# Patient Record
Sex: Male | Born: 1980 | Race: White | Hispanic: Yes | Marital: Single | Smoking: Former smoker
Health system: Southern US, Community
[De-identification: ages and names within clinical notes are randomized; demographics above are authoritative.]

## PROBLEM LIST (undated history)

## (undated) DIAGNOSIS — K625 Hemorrhage of anus and rectum: Secondary | ICD-10-CM

## (undated) HISTORY — DX: Hemorrhage of anus and rectum: K62.5

## (undated) HISTORY — PX: NO PAST SURGERIES: SHX2092

---

## 2001-03-16 ENCOUNTER — Emergency Department (HOSPITAL_COMMUNITY): Admission: EM | Admit: 2001-03-16 | Discharge: 2001-03-16 | Payer: Self-pay | Admitting: Emergency Medicine

## 2005-01-12 ENCOUNTER — Emergency Department (HOSPITAL_COMMUNITY): Admission: EM | Admit: 2005-01-12 | Discharge: 2005-01-12 | Payer: Self-pay | Admitting: Emergency Medicine

## 2006-09-05 ENCOUNTER — Emergency Department (HOSPITAL_COMMUNITY): Admission: EM | Admit: 2006-09-05 | Discharge: 2006-09-05 | Payer: Self-pay | Admitting: Emergency Medicine

## 2008-07-07 ENCOUNTER — Emergency Department (HOSPITAL_COMMUNITY): Admission: EM | Admit: 2008-07-07 | Discharge: 2008-07-07 | Payer: Self-pay | Admitting: Family Medicine

## 2010-07-24 LAB — POCT RAPID STREP A (OFFICE): Streptococcus, Group A Screen (Direct): NEGATIVE

## 2011-09-17 ENCOUNTER — Encounter (HOSPITAL_COMMUNITY): Payer: Self-pay | Admitting: *Deleted

## 2011-09-17 ENCOUNTER — Emergency Department (HOSPITAL_COMMUNITY)
Admission: EM | Admit: 2011-09-17 | Discharge: 2011-09-17 | Disposition: A | Discharge status: Home Health | Payer: Self-pay | Attending: Emergency Medicine | Admitting: Emergency Medicine

## 2011-09-17 DIAGNOSIS — L03221 Cellulitis of neck: Secondary | ICD-10-CM | POA: Insufficient documentation

## 2011-09-17 DIAGNOSIS — L0291 Cutaneous abscess, unspecified: Secondary | ICD-10-CM

## 2011-09-17 DIAGNOSIS — L0211 Cutaneous abscess of neck: Secondary | ICD-10-CM | POA: Insufficient documentation

## 2011-09-17 DIAGNOSIS — F17200 Nicotine dependence, unspecified, uncomplicated: Secondary | ICD-10-CM | POA: Insufficient documentation

## 2011-09-17 MED ORDER — IBUPROFEN 800 MG PO TABS
800.0000 mg | ORAL_TABLET | Freq: Once | ORAL | Status: AC
  Administered 2011-09-17: 800 mg via ORAL
  Filled 2011-09-17: qty 1

## 2011-09-17 MED ORDER — CEPHALEXIN 500 MG PO CAPS: 500.0000 mg | ORAL_CAPSULE | Freq: Four times a day (QID) | ORAL | Status: AC

## 2011-09-17 MED ORDER — IBUPROFEN 800 MG PO TABS: 800.0000 mg | ORAL_TABLET | Freq: Three times a day (TID) | ORAL | Status: AC | PRN

## 2011-09-17 MED ORDER — CEPHALEXIN 250 MG PO CAPS
500.0000 mg | ORAL_CAPSULE | Freq: Once | ORAL | Status: AC
  Administered 2011-09-17: 500 mg via ORAL
  Filled 2011-09-17: qty 2

## 2011-09-17 NOTE — ED Provider Notes (Signed)
History     CSN: [id number]  Arrival date & time 09/17/11  1932   None     Chief Complaint  Patient presents with  . Abscess    (Consider location/radiation/quality/duration/timing/severity/associated sxs/prior treatment) HPI Comments: Patient has an abscess to the upper mid back area, and a new tattoo.  It is less than 30, weeks old.  He has a history of recurrent abscesses, having one on his inner thigh, approximately 2 months ago.  He does denies fever, myalgias, nausea, vomiting, or diarrhea  Patient is a 31 y.o. male presenting with abscess. The history is provided by the patient.  Abscess  This is a recurrent problem. The current episode started less than one week ago. The problem occurs rarely. The problem has been gradually worsening. The abscess is present on the back. The problem is mild. The abscess is characterized by painfulness. The abscess first occurred at home. Pertinent negatives include no fever.    History reviewed. No pertinent past medical history.  History reviewed. No pertinent past surgical history.  No family history on file.  History  Substance Use Topics  . Smoking status: Current Everyday Smoker -- 3.0 packs/day  . Smokeless tobacco: Not on file  . Alcohol Use: 1.2 oz/week    2 Cans of beer per week      Review of Systems  Constitutional: Negative for fever and chills.  HENT: Negative for neck pain and neck stiffness.   Musculoskeletal: Negative for myalgias.  Skin: Positive for wound. Negative for rash.    Allergies  Review of patient's allergies indicates no known allergies.  Home Medications   Current Outpatient Rx  Name Route Sig Dispense Refill  . CEPHALEXIN 500 MG PO CAPS Oral Take 1 capsule (500 mg total) by mouth 4 (four) times daily. 30 capsule 0  . IBUPROFEN 800 MG PO TABS Oral Take 1 tablet (800 mg total) by mouth every 8 (eight) hours as needed for pain. 30 tablet 0    BP 124/70  Pulse 98  Temp(Src) 98.5 F (36.9 C)  (Oral)  Resp 16  SpO2 97%  Physical Exam  Constitutional: He appears well-developed and well-nourished.  HENT:  Head: Normocephalic.  Eyes: Pupils are equal, round, and reactive to light.  Neck: Normal range of motion.  Cardiovascular: Normal rate.   Pulmonary/Chest: Effort normal.  Neurological: He is alert.  Skin: Skin is warm.       ED Course  INCISION AND DRAINAGE Date/Time: 09/17/2011 9:00 PM Performed by: Arman Filter Authorized by: Arman Filter Consent: Verbal consent obtained. Risks and benefits: risks, benefits and alternatives were discussed Consent given by: patient Patient understanding: patient states understanding of the procedure being performed Patient identity confirmed: verbally with patient Time out: Immediately prior to procedure a "time out" was called to verify the correct patient, procedure, equipment, support staff and site/side marked as required. Type: abscess Body area: head/neck Location details: neck Anesthesia: local infiltration Local anesthetic: lidocaine 2% with epinephrine Anesthetic total: 1 ml Patient sedated: no Scalpel size: 11 Needle gauge: 22 Complexity: simple Drainage: purulent Drainage amount: scant Packing material: 1/4 in iodoform gauze Patient tolerance: Patient tolerated the procedure well with no immediate complications.   (including critical care time)  Labs Reviewed - No data to display No results found.   1. Abscess       MDM   Small abscess without surrounding cellulitis or erythema. Date       Arman Filter, NP 09/17/11 2104  Arman Filter, NP 09/17/11 2104

## 2011-09-17 NOTE — ED Notes (Signed)
Pt states he a sore started forming on his neck on Monday.  Today the sore began to drain clear drainage.  Abscess is swollen, red with clear exudate.  States he had a tatoo etched 3 weeks prior.  Pt does not remember last tetanus vaccination.

## 2011-09-17 NOTE — Discharge Instructions (Signed)
Absceso (Abscess) Un absceso (divieso o fornculo) es una zona infectada que contiene pus. CUIDADOS EN EL HOGAR  Tome slo la medicacin que le indic el mdico.   Mantenga la piel limpia alrededor del absceso. Mantenga limpia la ropa que pueda rozarlo.   Cambie todos los apsitos (vendajes) segn las indicaciones del mdico.   Evite el contacto directo de la piel con [employer]. La infeccin puede diseminarse por el contacto con otras personas a travs de la piel.   Mantenga una buena higiene y no comparta elementos personales.   No comparta equipos deportivos, toallas o jacuzzis. Dchese despus de cada prctica o torneo.   Si la zona que drena no puede cubrirse:   No practique deportes.   No enve al nio a la guardera [employer] la herida se haya curado o hasta que el lquido (de drenado) deje de [employer].   Comunquese con su mdico para concurrir a las visitas de control.  BUSQUE AYUDA DE INMEDIATO SI:  Aumenta el dolor, el enrojecimiento, o la Paramedic de la herida.   Drena lquido o sangra en la regin de la herida.   Tiene dolores musculares, escalofros, o fiebre.   Usted o su nio tienen una temperatura oral de ms de 102 F (38.9 C) y no puede controlarla con medicamentos.   Su beb tiene ms de 3 meses y su temperatura rectal es de 102 F (38.9 C) o ms.  ASEGRESE DE QUE:   Comprende esas instrucciones para el alta mdica.   Controlar su enfermedad.   Pedir ayuda de inmediato si no mejora o empeora.  Document Released: 06/26/2008 Document Revised: 03/19/2011 [facility] Patient Information 2012 [place], [place]. As discussed, in 2 days, remove the packing and allow to heal

## 2011-09-19 NOTE — ED Provider Notes (Signed)
Medical screening examination/treatment/procedure(s) were performed by non-physician practitioner and as supervising physician I was immediately available for consultation/collaboration.   Antaeus Karel A Britiany Silbernagel, MD 09/19/11 0743 

## 2011-10-20 ENCOUNTER — Encounter (HOSPITAL_COMMUNITY): Payer: Self-pay | Admitting: Emergency Medicine

## 2011-10-20 ENCOUNTER — Emergency Department (HOSPITAL_COMMUNITY)
Admission: EM | Admit: 2011-10-20 | Discharge: 2011-10-20 | Disposition: A | Discharge status: Home / Self Care | Payer: Self-pay | Attending: Emergency Medicine | Admitting: Emergency Medicine

## 2011-10-20 DIAGNOSIS — M542 Cervicalgia: Secondary | ICD-10-CM | POA: Insufficient documentation

## 2011-10-20 DIAGNOSIS — L02414 Cutaneous abscess of left upper limb: Secondary | ICD-10-CM

## 2011-10-20 DIAGNOSIS — F17200 Nicotine dependence, unspecified, uncomplicated: Secondary | ICD-10-CM | POA: Insufficient documentation

## 2011-10-20 DIAGNOSIS — IMO0002 Reserved for concepts with insufficient information to code with codable children: Secondary | ICD-10-CM | POA: Insufficient documentation

## 2011-10-20 MED ORDER — SULFAMETHOXAZOLE-TRIMETHOPRIM 800-160 MG PO TABS: 1.0000 | ORAL_TABLET | Freq: Two times a day (BID) | ORAL | Status: AC

## 2011-10-20 MED ORDER — ONDANSETRON HCL 4 MG/2ML IJ SOLN
4.0000 mg | Freq: Once | INTRAMUSCULAR | Status: AC
  Administered 2011-10-20: 4 mg via INTRAMUSCULAR
  Filled 2011-10-20: qty 2

## 2011-10-20 MED ORDER — HYDROCODONE-ACETAMINOPHEN 5-325 MG PO TABS: ORAL_TABLET | ORAL | Status: DC

## 2011-10-20 MED ORDER — CYCLOBENZAPRINE HCL 10 MG PO TABS: 10.0000 mg | ORAL_TABLET | Freq: Three times a day (TID) | ORAL | Status: AC | PRN

## 2011-10-20 MED ORDER — MORPHINE SULFATE 4 MG/ML IJ SOLN
4.0000 mg | Freq: Once | INTRAMUSCULAR | Status: AC
  Administered 2011-10-20: 4 mg via INTRAMUSCULAR
  Filled 2011-10-20: qty 1

## 2011-10-20 NOTE — ED Notes (Signed)
Pt c/o left arm pain with redness x 3 days and swelling; pt sts started small area but now large; pt c/o ingrown hair on back of neck

## 2011-10-20 NOTE — ED Notes (Signed)
Bacitracin, sterile guaze, & tape placed over incision on L arm

## 2011-10-20 NOTE — ED Provider Notes (Cosign Needed)
History  This chart was scribed for Ward Givens, MD by Erskine Emery. This patient was seen in room TR10C/TR10C and the patient's care was started at 18:40.   CSN: [id number]  Arrival date & time 10/20/11  1545   None     Chief Complaint  Patient presents with  . Arm Pain  . Abscess    (Consider location/radiation/quality/duration/timing/severity/associated sxs/prior treatment) HPI  Greg Maldonado is a 31 y.o. male who presents to the Emergency Department complaining of a constant, moderate draining abscess on his left forearm near his elbow for the last 3-4 days. Pt reports that the area is painful and reports associated redness and swelling. Pt also reports associated fever and chills last night. Pt denies any nausea and vomiting. Pt's partner reports there are spiders on their back porch. Pt reports he had another cyst about a month ago on the back of his neck. Pt reports that he uses Suave soap. Pt reports he is right handed. No prior medical hx reported. Pt also c/o pain in his left neck when he moves his head from side to side.   No PCP   History reviewed. No pertinent past medical history.  History reviewed. No pertinent past surgical history.  History reviewed. No pertinent family history.  History  Substance Use Topics  . Smoking status: Current Everyday Smoker -- 3.0 packs/day  . Smokeless tobacco: Not on file  . 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    Review of Systems  Constitutional: Positive for fever and chills.  Gastrointestinal: Negative for nausea and vomiting.  Skin:       Abscess on left arm    Allergies  Review of patient's allergies indicates no known allergies.  Home Medications   Current Outpatient Rx  Name Route Sig Dispense Refill  . ACETAMINOPHEN 500 MG PO TABS Oral Take 1,000 mg by mouth every 6 (six) hours as needed. For pain      BP 121/66  Pulse 100  Temp 99.6 F (37.6  C) (Oral)  Resp 18  SpO2 97%  Vital signs normal except low grade temp and tachycardia   Physical Exam  Nursing note and vitals reviewed. Constitutional: He is oriented to person, place, and time. He appears well-developed and well-nourished. No distress.  HENT:  Head: Normocephalic and atraumatic.  Right Ear: External ear normal.  Left Ear: External ear normal.  Eyes: Conjunctivae and EOM are normal. Pupils are equal, round, and reactive to light.  Neck: Normal range of motion. Neck supple. No tracheal deviation present.       Tender in the left trapezius muscle proximally  Cardiovascular: Normal rate.   Pulmonary/Chest: Effort normal. No respiratory distress.  Musculoskeletal: Normal range of motion. He exhibits tenderness.       8x10 cm area of erythema with an area of 7x8 cm induration on left proximal forearm near the elbow that is tender to palpation.   Neurological: He is alert and oriented to person, place, and time.  Skin: Skin is warm and dry.  Psychiatric: He has a normal mood and affect. His behavior is normal.    ED Course  Procedures (including critical care time)  Meds ordered this encounter  Medications  . morphine 4 MG/ML injection 4 mg    Sig:   . ondansetron (ZOFRAN) injection 4 mg    Sig:     INCISION AND DRAINAGE  Performed by: Ward Givens Consent: Verbal consent obtained. Risks and benefits: risks, benefits and alternatives were discussed Type: abscess  Body area: left forearm  Anesthesia: local infiltration  Local anesthetic: lidocaine 1% + 1 epinephrine  Anesthetic total: 2 ml  Complexity: complex Blunt dissection to break up loculations  Drainage: purulent  Drainage amount: moderate  Packing material: 1/4 in iodoform gauze  Patient tolerance: Patient tolerated the procedure well with no immediate complications.      DIAGNOSTIC STUDIES: Oxygen Saturation is 97% on room air, adequate by my interpretation.    COORDINATION OF  CARE:  18:49--I discussed treatment plan including lancing the abscess and a pain medication injection with pt and pt agreed. I informed the pt to try using dial soap for a while.   1. Abscess of forearm, left   2. Musculoskeletal neck pain    New Prescriptions   CYCLOBENZAPRINE (FLEXERIL) 10 MG TABLET    Take 1 tablet (10 mg total) by mouth 3 (three) times daily as needed for muscle spasms.   HYDROCODONE-ACETAMINOPHEN (NORCO) 5-325 MG PER TABLET    Take 1 or 2 po Q 6hrs for pain   SULFAMETHOXAZOLE-TRIMETHOPRIM (SEPTRA DS) 800-160 MG PER TABLET    Take 1 tablet by mouth 2 (two) times daily.    Plan discharge  Devoria Albe, MD, FACEP    MDM   I personally performed the services described in this documentation, which was scribed in my presence. The recorded information has been reviewed and considered.  Devoria Albe, MD, Armando Gang        Ward Givens, MD 10/20/11 2003

## 2012-08-15 ENCOUNTER — Encounter (HOSPITAL_COMMUNITY): Payer: Self-pay | Admitting: Nurse Practitioner

## 2012-08-15 ENCOUNTER — Emergency Department (HOSPITAL_COMMUNITY)
Admission: EM | Admit: 2012-08-15 | Discharge: 2012-08-15 | Disposition: A | Discharge status: Home / Self Care | Payer: Self-pay | Attending: Emergency Medicine | Admitting: Emergency Medicine

## 2012-08-15 DIAGNOSIS — Y9389 Activity, other specified: Secondary | ICD-10-CM | POA: Insufficient documentation

## 2012-08-15 DIAGNOSIS — X500XXA Overexertion from strenuous movement or load, initial encounter: Secondary | ICD-10-CM | POA: Insufficient documentation

## 2012-08-15 DIAGNOSIS — H9209 Otalgia, unspecified ear: Secondary | ICD-10-CM | POA: Insufficient documentation

## 2012-08-15 DIAGNOSIS — Y990 Civilian activity done for income or pay: Secondary | ICD-10-CM | POA: Insufficient documentation

## 2012-08-15 DIAGNOSIS — S93409A Sprain of unspecified ligament of unspecified ankle, initial encounter: Secondary | ICD-10-CM | POA: Insufficient documentation

## 2012-08-15 DIAGNOSIS — L0201 Cutaneous abscess of face: Secondary | ICD-10-CM | POA: Insufficient documentation

## 2012-08-15 DIAGNOSIS — Y9289 Other specified places as the place of occurrence of the external cause: Secondary | ICD-10-CM | POA: Insufficient documentation

## 2012-08-15 DIAGNOSIS — F17200 Nicotine dependence, unspecified, uncomplicated: Secondary | ICD-10-CM | POA: Insufficient documentation

## 2012-08-15 DIAGNOSIS — L03211 Cellulitis of face: Secondary | ICD-10-CM | POA: Insufficient documentation

## 2012-08-15 MED ORDER — CEPHALEXIN 500 MG PO CAPS: 500.0000 mg | ORAL_CAPSULE | Freq: Four times a day (QID) | ORAL | Status: DC

## 2012-08-15 MED ORDER — CEPHALEXIN 250 MG PO CAPS
500.0000 mg | ORAL_CAPSULE | Freq: Once | ORAL | Status: AC
  Administered 2012-08-15: 500 mg via ORAL
  Filled 2012-08-15: qty 2

## 2012-08-15 MED ORDER — TRAMADOL HCL 50 MG PO TABS: 50.0000 mg | ORAL_TABLET | Freq: Once | ORAL | Status: DC

## 2012-08-15 MED ORDER — TRAMADOL HCL 50 MG PO TABS
50.0000 mg | ORAL_TABLET | Freq: Once | ORAL | Status: AC
  Administered 2012-08-15: 50 mg via ORAL
  Filled 2012-08-15: qty 1

## 2012-08-15 MED ORDER — LIDOCAINE HCL (PF) 1 % IJ SOLN
5.0000 mL | Freq: Once | INTRAMUSCULAR | Status: AC
  Administered 2012-08-15: 5 mL

## 2012-08-15 NOTE — ED Notes (Signed)
I&D tray at bedside.

## 2012-08-15 NOTE — ED Provider Notes (Signed)
History     CSN: [id number]  Arrival date & time 08/15/12  1840   None     Chief Complaint  Patient presents with  . Wound Infection  . Ankle Pain  . Otalgia    (Consider location/radiation/quality/duration/timing/severity/associated sxs/prior treatment) HPI Comments: Patient states he twisted his left ankle at work on Friday.  Since that, time.  That's been moderately swollen with discomfort.  He is able to bear weight, and walk.  He has not done anything to alleviate the pain.  He also has an abscess on his chin, which he thought started as a pimple.  He squeezed it, which only made it worse Sunday.larger, and more red today.  His wife squeezed it and a moderate amount of pus came out.  It is now 2-3 times more swollen.  It has been patient has a history of an abscess previously.  Several years ago  Patient is a 32 y.o. male presenting with ankle pain and ear pain. The history is provided by the patient.  Ankle Pain Location:  Ankle Time since incident:  4 days Injury: yes   Ankle location:  L ankle Pain details:    Radiates to:  Does not radiate   Severity:  Mild   Onset quality:  Sudden   Duration:  4 days   Timing:  Constant   Progression:  Improving Associated symptoms: no fever   Otalgia Associated symptoms: no ear discharge and no fever     No past medical history on file.  No past surgical history on file.  No family history on file.  History  Substance Use Topics  . Smoking status: Current Every Day Smoker -- 3.00 packs/day  . Smokeless tobacco: Not on file  . Alcohol Use: 1.2 oz/week    2 Cans of beer per week      Review of Systems  Constitutional: Negative for fever and chills.  HENT: Positive for ear pain. Negative for trouble swallowing, neck stiffness and ear discharge.   Musculoskeletal: Positive for joint swelling.  Skin: Positive for wound.  All other systems reviewed and are negative.    Allergies  Review of patient's allergies  indicates no known allergies.  Home Medications   Current Outpatient Rx  Name  Route  Sig  Dispense  Refill  . acetaminophen (TYLENOL) 500 MG tablet   Oral   Take 1,000 mg by mouth every 6 (six) hours as needed for pain.          Marland Kitchen ibuprofen (ADVIL,MOTRIN) 200 MG tablet   Oral   Take 800 mg by mouth once.         . Multiple Vitamin (MULTIVITAMIN WITH MINERALS) TABS   Oral   Take 1 tablet by mouth daily.         . cephALEXin (KEFLEX) 500 MG capsule   Oral   Take 1 capsule (500 mg total) by mouth 4 (four) times daily.   28 capsule   0   . traMADol (ULTRAM) 50 MG tablet   Oral   Take 1 tablet (50 mg total) by mouth once.   30 tablet   0     BP 139/73  Pulse 108  Temp(Src) 99.1 F (37.3 C) (Oral)  Resp 20  SpO2 97%  Physical Exam  Constitutional: He is oriented to person, place, and time. He appears well-developed and well-nourished.  HENT:  Head: Normocephalic.  Eyes: Pupils are equal, round, and reactive to light.  Neck: Normal range of motion.  Cardiovascular: Normal rate and regular rhythm.   Pulmonary/Chest: Effort normal.  Abdominal: Soft.  Musculoskeletal: He exhibits edema. He exhibits no tenderness.  Neurological: He is alert and oriented to person, place, and time.  Skin: There is erythema.  Abscess to the apex of the chin    ED Course  INCISION AND DRAINAGE Date/Time: 08/15/2012 10:24 PM Performed by: Arman Filter Authorized by: Arman Filter Consent: Verbal consent obtained. Risks and benefits: risks, benefits and alternatives were discussed Consent given by: patient Patient understanding: patient states understanding of the procedure being performed Patient identity confirmed: verbally with patient Type: abscess Body area: head/neck Location details: face Anesthesia: local infiltration Local anesthetic: lidocaine 1% without epinephrine Anesthetic total: 2 ml Patient sedated: no Scalpel size: 11 Needle gauge: 22 Incision type:  single straight Complexity: simple Drainage: purulent Drainage amount: moderate Wound treatment: wound left open Patient tolerance: Patient tolerated the procedure well with no immediate complications.   (including critical care time)  Labs Reviewed - No data to display No results found.   1. Facial abscess   2. Ankle sprain and strain, left, initial encounter       MDM  Patient.  Abscessed chin, was I indeed, patient has a moderately sprained ankle from Friday.  Apply Ace bandage       Arman Filter, NP 08/15/12 2236

## 2012-08-15 NOTE — ED Notes (Addendum)
Pt with multiple complaints C/o red, painful, draining abscess to chin since saturday. L sided ear ache since waking this am. L ankle swelling since Sunday with no pain or known injuries. Took ibuprofen PTA with some relief

## 2012-08-15 NOTE — ED Notes (Signed)
Ace wrap applied to left ankle.

## 2012-08-16 NOTE — ED Provider Notes (Signed)
Medical screening examination/treatment/procedure(s) were performed by non-physician practitioner and as supervising physician I was immediately available for consultation/collaboration.   Richardean Canal, MD 08/16/12 2130

## 2013-10-02 ENCOUNTER — Encounter (HOSPITAL_COMMUNITY): Payer: Self-pay | Admitting: Emergency Medicine

## 2013-10-02 ENCOUNTER — Emergency Department (HOSPITAL_COMMUNITY)
Admission: EM | Admit: 2013-10-02 | Discharge: 2013-10-02 | Disposition: A | Discharge status: Home / Self Care | Payer: Self-pay | Attending: Emergency Medicine | Admitting: Emergency Medicine

## 2013-10-02 DIAGNOSIS — Z792 Long term (current) use of antibiotics: Secondary | ICD-10-CM | POA: Insufficient documentation

## 2013-10-02 DIAGNOSIS — S39012A Strain of muscle, fascia and tendon of lower back, initial encounter: Secondary | ICD-10-CM

## 2013-10-02 DIAGNOSIS — F17200 Nicotine dependence, unspecified, uncomplicated: Secondary | ICD-10-CM | POA: Insufficient documentation

## 2013-10-02 DIAGNOSIS — X503XXA Overexertion from repetitive movements, initial encounter: Secondary | ICD-10-CM | POA: Insufficient documentation

## 2013-10-02 DIAGNOSIS — Y92009 Unspecified place in unspecified non-institutional (private) residence as the place of occurrence of the external cause: Secondary | ICD-10-CM | POA: Insufficient documentation

## 2013-10-02 DIAGNOSIS — Y9389 Activity, other specified: Secondary | ICD-10-CM | POA: Insufficient documentation

## 2013-10-02 DIAGNOSIS — IMO0002 Reserved for concepts with insufficient information to code with codable children: Secondary | ICD-10-CM | POA: Insufficient documentation

## 2013-10-02 MED ORDER — TRAMADOL HCL 50 MG PO TABS: 50.0000 mg | ORAL_TABLET | Freq: Four times a day (QID) | ORAL | Status: DC | PRN

## 2013-10-02 MED ORDER — METHOCARBAMOL 500 MG PO TABS: 500.0000 mg | ORAL_TABLET | Freq: Two times a day (BID) | ORAL | Status: DC

## 2013-10-02 MED ORDER — IBUPROFEN 200 MG PO TABS: 800.0000 mg | ORAL_TABLET | Freq: Once | ORAL | Status: DC

## 2013-10-02 NOTE — Discharge Instructions (Signed)
Distensin de la cintura con rehabilitacin (Low Back Strain with Rehab) Una distensin es una lesin en la que el tendn o msculo se rasga. Los msculos y tendones de la cintura son susceptibles de sufrir distensiones. Sin embargo, estos msculos y tendones son muy fuertes y se requiere de una gran fuerza para lesionarlos. Los esguinces se clasifican en tres categoras. En el esguince de grado 1 hay dolor, pero el tendn no est estirado. En los esguinces de grado 2 hay un ligamento alargado, debido a un estiramiento o desgarro parcial. En el esguince de grado 2 an hay funcionamiento, aunque ste puede disminuir. Una distensin en grado 3 es la ruptura completa del msculo o el tendn, y suele quedar incapacitada la funcin. SNTOMAS  Dolor en la cintura.  Dolor en la espalda, que a menudo afecta ms a un lado que al otro.  Dolor que empeora con los movimientos y se siente en las caderas, las nalgas o la zona posterior del muslo.  Espasmos en los msculos de la espalda.  Hinchazn a lo largo de los msculos de la espalda.  Prdida de la fuerza en estos msculos.  Ruido de "crack" (crepitacin) al tocarlos. CAUSAS  La distensin se produce cuando se coloca una fuerza en el msculo o tendn que es mayor de lo que puede soportar. Las causas ms frecuentes de la lesin son:  Uso excesivo prolongado de la unidad msculo - tendn en la zona de la cintura, generalmente debido a una postura incorrecta.  Una nica lesin o fuerza violenta aplicada en la espalda. LOS RIESGOS AUMENTAN CON  Cualquier deporte en el que el movimiento ocasione una fuerza de giro en la espalda o una gran inclinacin de la cintura; (ftbol americano, rugby, levantamiento de pesas, bowling, golf, tenis, patinaje, deportes con raqueta, natacin, carrera, gimnasia, clavados).  Poca fuerza y flexibilidad.  No hacer un precalentamiento adecuado.  Historia familiar de dolor de cintura o trastornos en  discos.  Cirugas previas en la espalda (especialmente fusin).  Tcnica incorrecta al levantar objetos pesados.  Permanecer largo tiempo sentado, especialmente de manera incorrecta. MEDIDAS DE PREVENCIN  Aprenda y utilice la mecnica correcta al sentarse o al levantarse (mantener la postura correcta al sentarse; levantar objetos inclinando las rodillas y las piernas y no la cintura).  Precalentamiento adecuado y elongacin antes de la actividad.  Descanso y recuperacin entre actividades.  Mantener la forma fsica:  Fuerza, flexibilidad y resistencia muscular.  Capacidad cardiovascular. PRONSTICO Si se trata adecuadamente, generalmente es curable dentro de las 6 semanas. POSIBLES COMPLICACIONES:  La recurrencia frecuente de los sntomas puede dar como resultado un problema crnico.  La inflamacin crnica, degeneracin cicatrizal del tendn y ruptura parcial del tendn.  Demora de la curacin o de la resolucin de los sntomas.  Discapacidad prolongada. CONSIDERACIONES GENERALES PARA EL TRATAMIENTO El tratamiento inicial incluye el uso de medicamentos y la aplicacin de hielo para reducir el dolor y la inflamacin. Los ejercicios de elongacin y fortalecimiento pueden ayudar a reducir el dolor con la actividad. Los ejercicios pueden realizarse en el hogar o con un terapeuta. Los casos graves pueden requerir la derivacin a un fisioterapeuta para realizar una evaluacin y comenzar un tratamiento, como ultrasonido. El profesional podr indicarle el uso de un dispositivo ortopdico para ayudar a reducir el dolor y la inflamacin. A menudo, demasiado reposo en cama podr resultar en ms daos que beneficios. Podrn prescribirle inyecciones de corticoides. Sin embargo, esto deber reservarse para los casos ms graves. Es importante evitar el   uso de la espalda cuando se levantan objetos. Por la noche, se aconseja que usted duerma boca arriba, sobre un colchn firme y coloque una almohada  debajo de las rodillas. Si no se obtiene xito con el tratamiento conservador, ser necesario someterse a una ciruga.  MEDICAMENTOS   Si es necesaria la administracin de medicamentos para el dolor, se recomiendan los antiinflamatorios no esteroides, como aspirina e ibuprofeno y otros calmantes menores, como acetaminofeno.  No tome medicamentos para el dolor dentro de los 7 das previos a la ciruga.  El profesional podr prescribirle calmantes si lo considera necesario. Utilcelos como se le indique y slo cuando lo necesite.  Podr beneficiarse con pomadas sobre la piel.  En algunos casos se indica una inyeccin de corticosteroides. Estas inyecciones deben reservarse para los casos graves, porque slo se pueden administrar una determinada cantidad de veces. CALOR Y FRO   El fro (con hielo) debe aplicarse durante 10 a 15 minutos cada 2  3 horas para reducir la inflamacin y el dolor e inmediatamente despus de cualquier actividad que agrava los sntomas. Utilice bolsas o un masaje de hielo.  El calor puede usarse antes de elongar y de las actividades de fortalecimiento indicadas por el profesional, le fisioterapeuta o el entrenador. Utilice una bolsa trmica o un pao hmedo. SOLICITE ATENCIN MDICA SI:   Los sntomas empeoran o no mejoran en 2 a 4 semanas, an realizando un tratamiento.  Siente adormecimiento, debilitamiento o prdida de control de la vejiga o el intestino.  Desarrolla nuevos e inexplicables sntomas. (Los medicamentos indicados en el tratamiento le ocasionan efectos secundarios). EJERCICIOS  EJERCICIOS DE AMPLITUD DE MOVIMIENTOS Y ELONGACIN Distensin de cintura La mayora de las personas con dolor de espalda baja encuentran que sus sntomas empeoran al doblarse hacia adelante (flexin) o al arquear la regin inferior de la espalda (extensin). Los ejercicios que le ayudarn a resolver sus sntomas se centrarn en el movimiento contrario.  El mdico,  fisioterapeuta o entrenador le ayudarn a determinar qu ejercicios sern de ayuda para resolver su dolor de espalda. No realice ningn ejercicio sin consultarlo antes con el profesional. Discontinue los ejercicios que empeoran sus sntomas, hasta que hable con el mdico. Si siente dolor, entumecimiento u hormigueo que irradia hacia los glteos, piernas o pies, el objetivo de esta terapia es que estos sntomas se acerquen a la espalda y eventualmente desaparezcan. A veces, estos sntomas en las piernas mejoran, pero el dolor de espalda empeora. Este suele ser un indicio de progreso en su rehabilitacin. Asegrese de que estar atento a cualquier cambio en sus sntomas y las actividades que ha hecho en las 24 horas antes del cambio. Compartir esta informacin con su mdico le permitir mayor eficiencia para tratar su enfermedad.  Estos ejercicios le ayudarn en la recuperacin de la lesin. Los sntomas podrn aliviarse con o sin asistencia adicional de su mdico, fisioterapeuta o entrenador. Al completar estos ejercicios, recuerde:  Restaurar la flexibilidad del tejido ayuda a que las articulaciones recuperen el movimiento normal. Esto permite que el movimiento y la actividad sea ms saludables y menos dolorosos.  Para que sea efectiva, cada elongacin debe realizarse durante al menos 30 segundos.  La elongacin nunca debe ser dolorosa. Deber sentir slo un alargamiento o distensin suave del tejido que estira. EJERCICIOS DE AMPLITUD DE MOVIMIENTOS Y ELONGACIN: ELONGACION Flexin - una rodilla al pecho  Recustese en una cama dura o sobre el piso, con ambas piernas extendidas al frente.  Manteniendo una pierna en contacto con   el piso, lleve la rodilla opuesta al pecho. Mantenga la pierna en esa posicin, sostenindola por la zona posterior del muslo o por la rodilla.  Presione hasta sentir un suave estiramiento en la cintura. Mantenga esta posicin durante __________ segundos.  Libere la pierna  lentamente y repita el ejercicio con el lado opuesto. Reptalo __________ veces. Realice este ejercicio __________ veces por da.  ELONGACIN Flexin, dos rodillas al pecho   Recustese en una cama dura o sobre el piso, con ambas piernas extendidas al frente.  Manteniendo una pierna en contacto con el piso, lleve la rodilla opuesta al pecho.  Tense los msculos del estmago para apoyar la espalda y levante la otra rodilla hacia el pecho. Mantenga las piernas en su lugar y tmese por detrs de las caderas o las rodillas.  Con ambas rodillas en el pecho, tire hasta que sienta un estiramiento en la parte trasera de la espalda. Mantenga esta posicin durante __________ segundos.  Tense los msculos del estmago y baje las piernas de a una por vez. Reptalo __________ veces. Realice este ejercicio __________ veces por da.  ELONGACIN Rotacin de la zona baja del tronco  Recustese sobre una cama firme o sobre el suelo. Mantenga las piernas al frente, doble las rodillas de modo que ambas apunten hacia el techo y los pies queden bien apoyados en el piso.  Extienda los brazos a cada lado. Esto estabilizar la zona superior del cuerpo, manteniendo los hombros en contacto con el piso.  Suave y lentamente deje caer ambas rodillas juntas hacia un lado, hasta sentir un ligero estiramiento en la cintura. Mantenga esta posicin durante __________ segundos.  Tensione los msculos del estmago para sostener la cintura mientras lleva las rodillas nuevamente a la posicin inicial. Repita el ejercicio hacia el otro lado. Reptalo __________ veces. Realice este ejercicio __________ veces por da. EJERCICIOS DE AMPLITUD DE MOVIMIENTOS Y FLEXIBILIDAD: ELONGACIN Extensin posicin prona sobre los codos  Acustese sobre el estmago sobre el piso, una cama ser muy blanda. Coloque las palmas a una distancia igual al ancho de los hombres y a la altura de la cabeza.  Coloque los codos bajo los hombros. Si siente  dolor, colquese almohadas debajo del pecho.  Deje que su cuerpo se relaje, de modo que las caderas queden ms abajo y tengan ms contacto con el piso.  Mantenga esta posicin durante __________ segundos.  Vuelva lentamente a la posicin plana sobre el piso. Reptalo __________ veces. Realice este ejercicio __________ veces por da.  AMPLITUD DE MOVIMIENTOS - Extensin - flexin de brazos en posicin prona  Acustese sobre el estmago sobre el piso, una cama ser muy blanda. Coloque las palmas a una distancia igual al ancho de los hombres y a la altura de la cabeza.  Mantenga la espalda tan relajada como pueda, enderece lentamente los codos mientras mantiene las caderas contra el suelo. Puede modificar la posicin de las manos para estar ms cmodo. A medida que gana movimiento, sus manos quedarn ms por debajo de los hombros.  Mantenga cada posicin durante __________segundos.  Vuelva lentamente a la posicin plana sobre el piso. Reptalo __________ veces. Realice este ejercicio __________ veces por da.  AMPLITUD DE MOVIMIENTOS Cuadrpedo [place] vertebral neutral  Coloque las manos y las rodillas en una superficie firme. Las manos deben quedar a la altura de los hombros y las rodillas debajo de las caderas. Puede colocar algo debajo las rodillas para estar ms cmodo.  Haga caer la cabeza y apunte el cccix hacia el   suelo debajo de usted. De este modo se redondear la cintura, en una postura similar a un gato enojado. Mantenga esta posicin durante __________ segundos.  Lentamente levante la cabeza y afloje el cccix para que se hunda el cuerpo en un gran arco, como un caballo.  Mantenga esta posicin durante __________ segundos.  Reptalo hasta sentir calor en la cintura.  Ahora encuentre su "punto ideal". Ser la posicin ms cmoda entre las dos posiciones anteriores. En esta posicin es cuando su columna est neutral. Una vez que encuentre la posicin, tensione los msculos del  estmago para sostener la zona inferior de la espalda.  Mantenga esta posicin durante __________ segundos. Reptalo __________ veces. Realice este ejercicio __________ veces por da.  EJERCICIOS DE FORTALECIMIENTO - Distensin de la cintura Estos ejercicios le ayudarn en la recuperacin de la lesin. Estos ejercicios deben hacerse cerca de su "punto dulce". Este es el arco neutro, de la parte baja de la espalda, en algn lugar entre la posicin completamente redondeada y arqueada plenamente, que es la posicin menos dolorosa. Cuando se realiza en este nivel de seguridad del movimiento, estos ejercicios se pueden utilizar para las personas que tienen una lesin basada en flexin o extensin. Con estos ejercicios, los sntomas podrn desaparecer con o sin mayor intervencin del profesional, el fisioterapeuta o el entrenador. Al completar estos ejercicios, recuerde:   Los msculos pueden ganar la resistencia y la fuerza necesarias para las actividades diarias a travs de ejercicios controlados.  Realice los ejercicios como se lo indic el mdico, el fisioterapeuta o el entrenador. Aumente la resistencia y las repeticiones segn se le haya indicado.  Podr experimentar dolor o cansancio muscular, pero el dolor o molestia que trata de eliminar a travs de los ejercicios nunca debe empeorar. Si el dolor empeora, detngase y asegrese de que est siguiendo las directivas correctamente. Si an siente dolor luego de realizar lo ajustes necesarios, deber discontinuar el ejercicio hasta que pueda conversar con el profesional sobre el problema. FORTALECIMIENTO - Abdominales profundos - Inclinacin plvica  Recustese sobre una cama firme o sobre el suelo. Mantenga las piernas al frente, doble las rodillas de modo que ambas apunten hacia el techo y los pies queden bien apoyados en el piso.  Tensione la zona baja de los msculos abdominales para presionar la cintura contra el piso. Este movimiento har rotar su  pelvis de modo que el cccix quede hacia arriba y no apuntando a los pies o hacia el piso.  Con una tensin suave y respiracin pareja, mantenga esta posicin durante __________ segundos. Reptalo __________ veces. Realice este ejercicio __________ veces por da.  FORTALECIMIENTO Abdominales encogimiento abdominal  Recustese sobre una cama firme o sobre el suelo. Mantenga las piernas al frente, doble las rodillas de modo que ambas apunten hacia el techo y los pies queden bien apoyados en el piso. Cruce las manos sobre el pecho.  Apunte suavemente con la barbilla hacia abajo, sin doblar el cuello.  Tensione los abdominales y eleve lentamente el tronco la altura suficiente para despegar los omplatos. Si se eleva ms, pondr tensin excesiva en la cintura y esto no fortalecer ms los abdominales.  Controle la vuelta a la posicin inicial. Reptalo __________ veces. Realice este ejercicio __________ veces por da.  EN CUATRO MIEMBROS Cuadrpedo, elevacin de miembro superior e inferior opuestos   Coloque las manos y las rodillas en una superficie firme. Las manos deben quedar a la altura de los hombros y las rodillas debajo de las caderas. Puede colocar   algo debajo las rodillas para estar ms cmodo.  Encuentre la posicin neutral de la columna vertebral y tensione ligeramente los msculos abdominales de modo que pueda mantener esta posicin. Los hombros y las caderas deben formar un rectngulo paralelo con el suelo y recto.  Manteniendo el tronco firme, eleve la mano derecha a la altura del hombro y luego eleve la pierna izquierda a la altura de la cadera. Asegrese de no contener la respiracin. Mantenga cada posicin durante __________ segundos.  Con los msculos abdominales en tensin y la espalda firme, vuelva lentamente a la posicin inicial. Repita con el otro brazo y la otra pierna. Reptalo __________ veces. Realice este ejercicio __________ veces por da.  FORTALECIMIENTO Abdominales  bajos Elevacin de ambas rodillas  Recustese sobre una cama firme o sobre el suelo. Mantenga las piernas al frente, doble las rodillas de modo que ambas apunten hacia el techo y los pies queden bien apoyados en el piso.  Tensione los msculos abdominales que se encuentran en la cintura y eleve lentamente ambas rodillas hasta que queden sobre las caderas. Asegrese de no contener la respiracin.  Mantenga esta posicin durante __________ segundos. Usando los abdominales, regrese a la posicin inicial en un modo lento y controlado. Reptalo __________ veces. Realice este ejercicio __________ veces por da.  CONSIDERACIONES ACERCA DE LA POSTURA Y LA MECNICA DEL CUERPO Ditensin de la cintura Si mantiene una postura correcta cuando se encuentre de pie, sentado o realizando sus actividades, reducir el estrs en los diferentes tejidos del cuerpo, y permitir a los tejidos lesionados la posibilidad de curarse y limitar las experiencias dolorosas. A continuacin se indican pautas generales para mejorar la postura- Su mdico o fisioterapeuta le dar instrucciones especficas segn sus necesidades. Al leer estas pautas recuerde:  Los ejercicios indicados por su mdico lo ayudarn a lograr la flexibilidad y la fuerza para mantener las posturas correctas.  La postura correcta proporciona el mejor entorno de trabajo para las articulaciones. Las articulaciones se desgastan menos cuando estn sostenidas adecuadamente por una columna vertebral en buena postura. Esto significa que su cuerpo estar ms sano y doler menos.  La correcta postura debe practicarse en todas las actividades, especialmente al estar sentado o de pie durante mucho tiempo. Tambin es importante al realizar actividades repetitivas de bajo estrs (tipeo) o una actividad nica y pesada. POSICIONES DE DESCANSO Tenga en cuenta cules son las posturas que ms dolor le causan al elegir una posicin de descanso. Si siente dolor con las actividades  en que deba realizar una flexin (sentarse, inclinarse, detenerse, ponerse en cuclillas), elija una posicin que le permita descansar en una postura menos flexionada. Evite curvarse en posicin fetal cuando se encuentre de lado. Si el dolor empeora con las actividades basadas en la extensin (estar de pie durante un tiempo prolongado, trabajar con las manos por arriba de la cabeza) evite descansar en una posicin extendida durante mucho tiempo, como dormir sobre el estmago. La mayora de las personas encontrar cmodo el descanso sobre la columna vertebral en una posicin neutral, ni muy redondeada ni muy arqueada. Recustese sobre su lado en una cama que no est hundida con una almohada entre las rodillas o sobre la espalda con una almohada bajo las rodillas, y sentir alivio. Tenga en cuenta que cualquier posicin en tiempo prolongado, no importa si es una postura correcta, puede provocarle rigidez. POSTURAS CORRECTAS PARA SENTARSE Con el fin de minimizar el estrs y el malestar en su columna, deber sentarse con la postura correcta. Sentarse con   una buena postura debe ser algo sin esfuerzo para un cuerpo sano. Recuperar una buena postura es un proceso gradual. Muchas personas pueden trabajar ms cmodas mediante el uso de diferentes soportes hasta que tengan la flexibilidad y la fuerza para mantener esta postura por su cuenta. Al sentarse con la postura correcta, los odos deben estar sobre los hombros y los hombros sobre las caderas. Debe utilizar el respaldo de la silla para apoyar la espalda. La espalda estar en una posicin neutral, ligeramente arqueada. Puede colocar una pequea almohada o toalla doblada en la base de la espalda baja para apoyo.  Si trabaja en un escritorio, cree un ambiente que le proporciones un buen soporte y una postura erguida. Sin apoyo adicional, msculos se cansan, lo que lleva a una tensin excesiva en las articulaciones y otros tejidos. Tenga en cuenta estas  recomendaciones: SILLA:   La silla debe poder deslizarse por debajo del escritorio cuando su espalda tome contacto con el respaldo. Esto le permitir trabajar ms cerca.  La altura de la silla debe permitirle que los ojos tengan el nivel de la parte superior del monitor y las manos estn ms abajo que los codos. POSICIN DEL CUERPO  Los pies deben tener contacto con el piso. Si no es posible, use un posapies.  Mantenga las orejas sobre los hombros. Esto reducir el estrs en el cuello y en la cintura. POSTURAS INCORRECTAS PARA SENTARSE  Si se siente cansado e incapaz de asumir una postura sentada sana, no se eche hacia atrs. Esto pone una tensin excesiva en los tejidos de su espalda, y causa ms dao y dolor. Entre las opciones ms saludables se incluyen:  El uso de ms apoyo, como una almohada lumbar.  Cambio de tareas, a algo que demande una posicin vertical o caminar.  Tomar una breve caminata.  Recostarse y descansar en una posicin neutral. DE PIE DURANTE UN TIEMPO PROLONGADO E INCLINADO LIGERAMENTE HACIA ADELANTE Cuando deba realizar una tarea que requiera inclinacin hacia adelante estando de pie en el mismo sitio durante mucho tiempo, coloque un pie en un objeto de 2 a 4 pulgadas de alto, para mantener una mejor postura. Cuando ambos pies estn en el piso, la zona inferior de la espalda tiene a perder su ligera curvatura hacia adentro. Si esta curva se aplana (o se pronuncia demasiado) la espalda y las articulaciones experimentarn demasiado estrs, se fatigarn ms rpidamente y causarn dolor. POSTURAS CORRECTAS PARA ESTAR DE PIE Una postura adecuada de pie realizarse en todas las actividades diarias, incluso si slo toman un momento, como al cepillarse los dientes. Como en la postura de sentado, los odos deben estar sobre los hombros y los hombros sobre las caderas. Deber mantener una ligera tensin en sus msculos abdominales para asegurar la columna vertebral. El cccix  debe apuntar hacia el suelo, no detrs de su cuerpo, ya que resultara en una curvatura de la espalda sobre-extendida.  POSTURAS INCORRECTAS PARA ESTAR DE PIE Las posturas incorrectas para estar de pie incluyen tener la cabeza hacia delante, las rodillas bloqueadas o una excesiva curvatura de la espalda. CAMINAR Camine en una postura erguida. Las orejas, hombros y caderas deben estar alineados. ACTIVIDAD PROLONGADA EN UNA POSICIN FLEXIONADA Al completar una tarea que requiere que se doble la cintura hacia adelante o inclinarse sobre una superficie baja, trate de encontrar una manera de estabilizar 3 de cada 4 de sus miembros. Puede colocar una mano o el codo en el muslo, o descansar una rodilla en la superficie en   la que est apoyado. Esto le proporcionar ms estabilidad para que sus msculos no se cansen tan rpidamente. El mantener las rodillas relajadas, o ligeramente dobladas, tambin reducir el estrs en la espalda baja. TCNICAS CORRECTAS PARA LEVANTAR OBJETOS SI:   Asumir una postura amplia. Esto le proporcionar ms estabilidad y la oportunidad de acercarse lo ms posible al objeto que se est levantando.  Tense los abdominales para asegurar la columna vertebral. Doble las rodillas y las caderas. Manteniendo la espalda en una posicin neutral, haga el esfuerzo con los msculos de la pierna. Levntese con las piernas, manteniendo la espalda derecha.  Pruebe el peso de los objetos desconocidos antes de tratar de levantarlos.  Trate de mantener los codos hacia abajo y a los lados, con el fin de obtener la fuerza de los hombros al llevar un objeto.  Siempre pida ayuda a otra persona cuando deba levantar objetos pesados o incmodos. TCNICAS INCORRECTAS PARA LEVANTAR OBJETOS NO:   Bloquee rodillas al levantar, aunque sea un objeto pequeo.  Se doble ni gire. Gire sobre los pies ni los mueva cuando necesite cambiar de direccin.  Considere que no puede levantar incluso un clip de  papel con seguridad, sin una postura correcta. Document Released: 01/14/2006 Document Revised: 06/22/2011 [employer] Patient Information 2015 [employer]. This information is not intended to replace advice given to you by your health care provider. Make sure you discuss any questions you have with your health care provider.  

## 2013-10-02 NOTE — ED Provider Notes (Signed)
CSN: [id number]     Arrival date & time 10/02/13  1253 History  This chart was scribed for non-physician practitioner, Fayrene HelperBowie Haleigh Desmith, PA-C working with Junius ArgyleForrest S Harrison, MD by Greggory StallionKayla Andersen, ED scribe. This patient was seen in room TR09C/TR09C and the patient's care was started at 1:33 PM.   Chief Complaint  Patient presents with  . Back Pain   The history is provided by the patient. No language interpreter was used.   HPI Comments: Greg Maldonado is a 33 y.o. male who presents to the Emergency Department complaining of sudden onset, sharp right lower back pain that started yesterday after helping his neighbor with a fence. Pain does not radiate. Movement worsens the pain. He has taken tylenol with no relief. Denies fever, chills, abdominal pain, difficulty urinating, bowel or bladder incontinence, numbness. Denies history of back problems, IV drug use or cancer.   History reviewed. No pertinent past medical history. History reviewed. No pertinent past surgical history. History reviewed. No pertinent family history. History  Substance Use Topics  . Smoking status: Current Every Day Smoker -- 3.00 packs/day  . Smokeless tobacco: Not on file  . Alcohol Use: 1.2 oz/week    2 Cans of beer per week    Review of Systems  Constitutional: Negative for fever and chills.  Gastrointestinal: Negative for abdominal pain.  Genitourinary: Negative for difficulty urinating.       Negative for bowel or bladder incontinence.  Musculoskeletal: Positive for back pain.  Neurological: Negative for numbness.  All other systems reviewed and are negative.  Allergies  Review of patient's allergies indicates no known allergies.  Home Medications   Prior to Admission medications   Medication Sig Start Date End Date Taking? Authorizing Provider  acetaminophen (TYLENOL) 500 MG tablet Take 1,000 mg by mouth every 6 (six) hours as needed for pain.     Historical Provider, MD  cephALEXin (KEFLEX) 500 MG capsule  Take 1 capsule (500 mg total) by mouth 4 (four) times daily. 08/15/12   Arman FilterGail K Schulz, NP  ibuprofen (ADVIL,MOTRIN) 200 MG tablet Take 800 mg by mouth once.    Historical Provider, MD  Multiple Vitamin (MULTIVITAMIN WITH MINERALS) TABS Take 1 tablet by mouth daily.    Historical Provider, MD  traMADol (ULTRAM) 50 MG tablet Take 1 tablet (50 mg total) by mouth once. 08/15/12   Arman FilterGail K Schulz, NP   BP 120/71  Pulse 82  Temp(Src) 98 F (36.7 C) (Oral)  Resp 18  SpO2 96%  Physical Exam  Nursing note and vitals reviewed. Constitutional: He is oriented to person, place, and time. He appears well-developed and well-nourished. No distress.  HENT:  Head: Normocephalic and atraumatic.  Eyes: Conjunctivae and EOM are normal.  Neck: Neck supple. No tracheal deviation present.  Cardiovascular: Normal rate.   Pulmonary/Chest: Effort normal. No respiratory distress.  Musculoskeletal: Normal range of motion.  No significant midline spine tenderness, crepitus or step offs. No overlying skin changes. Pain to right para lumbar spine. Negative straight leg raise.   Neurological: He is alert and oriented to person, place, and time.  Patellar DTRs intact. No foot drop. Normal strength in lower extremities.   Skin: Skin is warm and dry.  Psychiatric: He has a normal mood and affect. His behavior is normal.    ED Course  Procedures (including critical care time)  DIAGNOSTIC STUDIES: Oxygen Saturation is 96% on RA, normal by my interpretation.    COORDINATION OF CARE: 1:36 PM-Discussed treatment plan which includes  a muscle relaxer and warm compresses with pt at bedside and pt agreed to plan. Will give pt orthopedic referral and advised him to follow up if symptoms do not started resolving in 5 days.   Labs Review Labs Reviewed - No data to display  Imaging Review No results found.   EKG Interpretation None      MDM   Final diagnoses:  Repetitive strain injury of lower back, initial encounter     BP 120/71  Pulse 82  Temp(Src) 98 F (36.7 C) (Oral)  Resp 18  SpO2 96%   I personally performed the services described in this documentation, which was scribed in my presence. The recorded information has been reviewed and is accurate.  Fayrene HelperBowie Lacie Landry, PA-C 10/02/13 1340

## 2013-10-02 NOTE — ED Provider Notes (Signed)
Medical screening examination/treatment/procedure(s) were performed by non-physician practitioner and as supervising physician I was immediately available for consultation/collaboration.   EKG Interpretation None        Forrest S Harrison, MD 10/02/13 1924 

## 2013-10-02 NOTE — ED Notes (Signed)
Patient C/o right back pain. States that he was pulling a fence yesterday and felt a sharp pain in his right back. States that pain has been continuous since then. The pain shoots up his back but not down his leg.  States that he has taken Advil but it did not help.

## 2013-10-02 NOTE — ED Notes (Signed)
Per pt sts that he was helping a neighbor with a fence yesterday and since has been having right lower back pain.

## 2014-02-25 ENCOUNTER — Emergency Department (HOSPITAL_COMMUNITY)
Admission: EM | Admit: 2014-02-25 | Discharge: 2014-02-25 | Disposition: A | Discharge status: Home / Self Care | Payer: Self-pay | Attending: Emergency Medicine | Admitting: Emergency Medicine

## 2014-02-25 ENCOUNTER — Encounter (HOSPITAL_COMMUNITY): Payer: Self-pay | Admitting: Emergency Medicine

## 2014-02-25 ENCOUNTER — Emergency Department (HOSPITAL_COMMUNITY): Payer: Self-pay

## 2014-02-25 DIAGNOSIS — Y9289 Other specified places as the place of occurrence of the external cause: Secondary | ICD-10-CM | POA: Insufficient documentation

## 2014-02-25 DIAGNOSIS — S62304A Unspecified fracture of fourth metacarpal bone, right hand, initial encounter for closed fracture: Secondary | ICD-10-CM | POA: Insufficient documentation

## 2014-02-25 DIAGNOSIS — Z792 Long term (current) use of antibiotics: Secondary | ICD-10-CM | POA: Insufficient documentation

## 2014-02-25 DIAGNOSIS — S62308A Unspecified fracture of other metacarpal bone, initial encounter for closed fracture: Secondary | ICD-10-CM

## 2014-02-25 DIAGNOSIS — Y9389 Activity, other specified: Secondary | ICD-10-CM | POA: Insufficient documentation

## 2014-02-25 DIAGNOSIS — Y990 Civilian activity done for income or pay: Secondary | ICD-10-CM | POA: Insufficient documentation

## 2014-02-25 DIAGNOSIS — S62306A Unspecified fracture of fifth metacarpal bone, right hand, initial encounter for closed fracture: Secondary | ICD-10-CM | POA: Insufficient documentation

## 2014-02-25 DIAGNOSIS — Z79899 Other long term (current) drug therapy: Secondary | ICD-10-CM | POA: Insufficient documentation

## 2014-02-25 DIAGNOSIS — W11XXXA Fall on and from ladder, initial encounter: Secondary | ICD-10-CM | POA: Insufficient documentation

## 2014-02-25 DIAGNOSIS — S6990XA Unspecified injury of unspecified wrist, hand and finger(s), initial encounter: Secondary | ICD-10-CM

## 2014-02-25 DIAGNOSIS — Z720 Tobacco use: Secondary | ICD-10-CM | POA: Insufficient documentation

## 2014-02-25 MED ORDER — AMOXICILLIN 500 MG PO CAPS: 500.0000 mg | ORAL_CAPSULE | Freq: Three times a day (TID) | ORAL | Status: DC

## 2014-02-25 MED ORDER — HYDROCODONE-ACETAMINOPHEN 5-325 MG PO TABS: ORAL_TABLET | ORAL | Status: DC

## 2014-02-25 MED ORDER — OXYCODONE-ACETAMINOPHEN 5-325 MG PO TABS
1.0000 | ORAL_TABLET | Freq: Once | ORAL | Status: AC
  Administered 2014-02-25: 1 via ORAL
  Filled 2014-02-25: qty 1

## 2014-02-25 MED ORDER — OXYCODONE-ACETAMINOPHEN 5-325 MG PO TABS: ORAL_TABLET | ORAL | Status: DC

## 2014-02-25 NOTE — ED Provider Notes (Signed)
CSN: [id number]     Arrival date & time 02/25/14  1644 History  This chart was scribed for non-physician practitioner, Greg EmeryNicole Simone Rodenbeck, PA-C,working with Greg CoKevin M Campos, MD, by Greg PlumberJennifer Maldonado, ED Scribe. This patient was seen in room TR08C/TR08C and the patient's care was started at 6:29 PM.  Chief Complaint  Patient presents with  . Hand Pain   Patient is a 33 y.o. male presenting with hand pain. The history is provided by the patient. No language interpreter was used.  Hand Pain    HPI Comments:  Greg Maldonado is a 33 y.o. male who presents to the Emergency Department complaining of a right hand injury that he sustained by falling on the hand that holding a hammer two days ago. He states he was standing on the top of a ladder and lost his balance and fell catching himself on the closed hand. He reports associated swelling and severe pain. Pt has not been taking anything for pain, however he has been icing it. Moving the hand or touching it makes the pain worse. He denies alleviating factors. He is right-hand dominant. He denies numbness, weakness, or tingling of the hand. He denies head injury, LOC, redness or any wounds.  History reviewed. No pertinent past medical history. History reviewed. No pertinent past surgical history. No family history on file. History  Substance Use Topics  . Smoking status: Current Every Day Smoker -- 3.00 packs/day  . Smokeless tobacco: Not on file  . Alcohol Use: 1.2 oz/week    2 Cans of beer per week    Review of Systems  Musculoskeletal: Positive for joint swelling and arthralgias.  Skin: Negative for color change and wound.  Neurological: Negative for syncope, weakness and numbness.  All other systems reviewed and are negative.   Allergies  Review of patient's allergies indicates no known allergies.  Home Medications   Prior to Admission medications   Medication Sig Start Date End Date Taking? Authorizing Provider  acetaminophen (TYLENOL)  500 MG tablet Take 1,000 mg by mouth every 6 (six) hours as needed for pain.     Historical Provider, MD  cephALEXin (KEFLEX) 500 MG capsule Take 1 capsule (500 mg total) by mouth 4 (four) times daily. 08/15/12   Arman FilterGail K Schulz, NP  ibuprofen (ADVIL,MOTRIN) 200 MG tablet Take 4 tablets (800 mg total) by mouth once. 10/02/13   Fayrene HelperBowie Tran, PA-C  methocarbamol (ROBAXIN) 500 MG tablet Take 1 tablet (500 mg total) by mouth 2 (two) times daily. 10/02/13   Fayrene HelperBowie Tran, PA-C  Multiple Vitamin (MULTIVITAMIN WITH MINERALS) TABS Take 1 tablet by mouth daily.    Historical Provider, MD  oxyCODONE-acetaminophen (PERCOCET/ROXICET) 5-325 MG per tablet 1 to 2 tabs PO q6hrs  PRN for pain 02/25/14   Greg EmeryNicole Greg Shillingford, PA-C   Triage Vitals: BP 124/74 mmHg  Pulse 76  Temp(Src) 98.4 F (36.9 C) (Oral)  Resp 18  Ht 5\' 7"  (1.702 m)  Wt 180 lb (81.647 kg)  BMI 28.19 kg/m2  SpO2 99% Physical Exam  Constitutional: He is oriented to person, place, and time. He appears well-developed and well-nourished.  HENT:  Head: Normocephalic and atraumatic.  Mouth/Throat: Oropharynx is clear and moist.  Eyes: EOM are normal. Pupils are equal, round, and reactive to light.  Neck: Normal range of motion. Neck supple.  No midline C-spine  tenderness to palpation or step-offs appreciated. Patient has full range of motion without pain.   Cardiovascular: Normal rate, regular rhythm and intact distal pulses.   Pulmonary/Chest:  Effort normal. No respiratory distress.  Abdominal: Soft.  Musculoskeletal: Normal range of motion. He exhibits no edema or tenderness.  Significant swelling and slightly reduced range of motion to right fifth and fourth MTP, there is no overlying laceration, distal sensation is grossly intact.  Neurological: He is alert and oriented to person, place, and time.  Skin: Skin is warm and dry.  Psychiatric: He has a normal mood and affect. His behavior is normal.  Nursing note and vitals reviewed.   ED Course   Procedures (including critical care time) DIAGNOSTIC STUDIES: Oxygen Saturation is 99% on RA, normal by my interpretation.   COORDINATION OF CARE: 6:36 PM- Will prescribe pain medication and refer to hand specialist. Advised pt to elevate the hand and take Ibuprofen for inflammation. Pt verbalizes understanding and agrees to plan.  Medications  oxyCODONE-acetaminophen (PERCOCET/ROXICET) 5-325 MG per tablet 1 tablet (1 tablet Oral Given 02/25/14 1850)    Labs Review Labs Reviewed - No data to display  Imaging Review Dg Hand Complete Right  02/25/2014   CLINICAL DATA:  RIGHT hand swelling which began after a fall from a ladder. This occurred 02/23/2014. Trauma at that time. Initial encounter.  EXAM: RIGHT HAND - COMPLETE 3+ VIEW  COMPARISON:  None.  FINDINGS: There is a comminuted fracture of the neck of the fifth metacarpal. A second fracture involves the head of the fourth metacarpal. Slight volar angulation of both metacarpal fractures. There is lateral soft tissue swelling. No dislocation. Carpal bones grossly intact.  IMPRESSION: Fractures of the distal fourth and fifth metacarpals as described. Soft tissue swelling. Comminution is observed of the fifth metacarpal fracture.   Electronically Signed   By: Davonna Maldonado M.D.   On: 02/25/2014 17:41     EKG Interpretation None      MDM   Final diagnoses:  Fracture of fifth metacarpal bone of right hand, closed, initial encounter  Fracture of fourth metacarpal bone, closed, initial encounter    Filed Vitals:   02/25/14 1709 02/25/14 1917 02/25/14 1927  BP: 124/74 117/70 121/75  Pulse: 76 77 76  Temp: 98.4 F (36.9 C) 98.2 F (36.8 C) 99.3 F (37.4 C)  TempSrc: Oral Oral Oral  Resp: 18 20 18   Height: 5\' 7"  (1.702 m)    Weight: 180 lb (81.647 kg)    SpO2: 99% 98% 100%    Medications  oxyCODONE-acetaminophen (PERCOCET/ROXICET) 5-325 MG per tablet 1 tablet (1 tablet Oral Given 02/25/14 1850)    Greg Maldonado is a 33 y.o.  male presenting with Right fourth and fifth metacarpal fracture. Patient works as a Environmental education officer, he was on a ladder and had a hammer in his hand. He fell to the ground and kept his hand gripped around his hammer. Although the story is a little implausible I do think that he is telling the truth. Patient has no abrasions or lacerations, I doubt this is a fight bite. Fracture shows a comminuted right fifth metacarpal and another fracture on the distal right fourth. Patient will be placed in an ulnar gutter splint, he will be given a sling, aggressive pain medication and advised to call follow closely with hand surgeon Dr. Izora Ribas.   Evaluation does not show pathology that would require ongoing emergent intervention or inpatient treatment. Pt is hemodynamically stable and mentating appropriately. Discussed findings and plan with patient/guardian, who agrees with care plan. All questions answered. Return precautions discussed and outpatient follow up given.   Discharge Medication List as of 02/25/2014  7:33 PM  START taking these medications   Details  oxyCODONE-acetaminophen (PERCOCET/ROXICET) 5-325 MG per tablet 1 to 2 tabs PO q6hrs  PRN for pain, Print        I personally performed the services described in this documentation, which was scribed in my presence. The recorded information has been reviewed and is accurate.    Greg Emeryicole Aneka Fagerstrom, PA-C 02/25/14 2045  Greg CoKevin M Campos, MD 02/28/14 [phone]

## 2014-02-25 NOTE — Progress Notes (Signed)
Orthopedic Tech Progress Note Patient Details:  Roetta SessionsJosue Farha 1980-12-22 [id number]  Ortho Devices Type of Ortho Device: Ace wrap, Arm sling, Ulna gutter splint Ortho Device/Splint Location: rue Ortho Device/Splint Interventions: Application   Armenta Erskin 02/25/2014, 7:13 PM

## 2014-02-25 NOTE — Discharge Instructions (Signed)
Rest, Ice intermittently (in the first 24-48 hours), Gentle compression with an Ace wrap, and elevate (Limb above the level of the heart)   Take up to 800mg  of ibuprofen (that is usually 4 over the counter pills)  3 times a day for 5 days. Take with food.  Take percocet for breakthrough pain, do not drink alcohol, drive, care for children or do other critical tasks while taking percocet.  Only use the arm sling for up to 2 days. Take the arm out and rotate the shoulder every 4 hours.   Do not hesitate to return to the emergency room for any new, worsening or concerning symptoms.  Please obtain primary care using resource guide below. But the minute you were seen in the emergency room and that they will need to obtain records for further outpatient management.       Emergency Department Resource Guide 1) Find a Doctor and Pay Out of Pocket Although you won't have to find out who is covered by your insurance plan, it is a good idea to ask around and get recommendations. You will then need to call the office and see if the doctor you have chosen will accept you as a new patient and what types of options they offer for patients who are self-pay. Some doctors offer discounts or will set up payment plans for their patients who do not have insurance, but you will need to ask so you aren't surprised when you get to your appointment.  2) Contact Your Local Health Department Not all health departments have doctors that can see patients for sick visits, but many do, so it is worth a call to see if yours does. If you don't know where your local health department is, you can check in your phone book. The CDC also has a tool to help you locate your state's health department, and many state websites also have listings of all of their local health departments.  3) Find a Walk-in Clinic If your illness is not likely to be very severe or complicated, you may want to try a walk in clinic. These are popping up  all over the country in pharmacies, drugstores, and shopping centers. They're usually staffed by nurse practitioners or physician assistants that have been trained to treat common illnesses and complaints. They're usually fairly quick and inexpensive. However, if you have serious medical issues or chronic medical problems, these are probably not your best option.  No Primary Care Doctor: - Call Health Connect at  [phone] - they can help you locate a primary care doctor that  accepts your insurance, provides certain services, etc. - Physician Referral Service- [phone]  Chronic Pain Problems: Organization         Address  Phone   Notes  [facility]  [phone] Patients need to be referred by their primary care doctor.   Medication Assistance: Organization         Address  Phone   Notes  [facility] [address] [place], [place] [address] [phone] --Must be a resident of [facility] -- Must have NO insurance coverage whatsoever (no Medicaid/ Medicare, etc.) -- The pt. MUST have a primary care doctor that directs their care regularly and follows them in the community   Med[facility]ist  [phone]   [employer]  [phone]    Agencies that provide inexpensive medical care: Organization         Address  Phone   Notes  [facility]  [phone]   [facility]    [phone]   [facility] [address] [place], [place] [address] [phone]   [facility] [address], [place] [phone]   [facility]anned Parenthood    [phone]) [phone]   [facility]    [phone]   [facility]  [address], [place] Phone:  [phone], Fax:  [phone]) [phone] Hours of Operation:  9 am - 6 pm, M-F.  Also accepts Medicaid/Medicare and self-pay.  [facility]  [address], [place] Phone: [phone], Fax: [phone]. Hours of Operation:  8:30 am - 5:30 pm, M-F.  Also accepts Medicaid and self-pay.  [facility] [place] [address], [place] Phone: [phone]) [phone]   [facility] [address], [place], [place] [phone], Ext. [phone] Mondays & Thursdays: 7-9 AM.  First 15 patients are seen on a first come, first serve basis.    Medicaid-accepting [facility] Providers:  Organization         Address  Phone   Notes  [facility] [address], [place] [phone] Also accepts self-pay patients.  [facility] [address], [place]  [phone]) [phone]   [facility] [address], [place] [phone]   [facility] [address] [place] [address], [place] [phone]   Renaye RakersVeita Bland [address], [place]   [phone] Only accepts [place] Access [place] patients after they have their name applied to their card.   Self-Pay (no insurance) in [facility]:  Organization         Address  Phone   Notes  Sickle Cell Patients, [facility] [address], [place] [phone]   [facility] [address], [place] [phone]) [phone]   [facility] [place]  [address], [place] [phone]   [facility]/Dr. Osei-Bonsu  [address] [place] [address], [place] or [id number], [place] [phone] Phone number for both [place] and [place] locations is the same.  [facility] [address], [place] [phone]   [facility] [address] [place] [address], [place] or [address] [phone]   [facility] [address], [place] [phone]) [phone], phone; [phone], fax Sees patients 1st and 3rd Saturday of every  month.  Must not qualify for public or private insurance (i.e. Medicaid, Medicare, [place] Health Choice, Veterans' Benefits)  Household income should be no more than 200% of the poverty level The clinic cannot treat you if you are pregnant or think you are pregnant  Sexually transmitted diseases are not treated at the clinic.    Dental Care: Organization         Address  Phone  Notes  [facility] [address], [place] [phone] Accepts children up to age 32 who are enrolled in [place] or [place] Health Choice; pregnant women with a Medicaid card; and children who have applied for Medicaid or [place] Health Choice, but were declined, whose parents can pay a reduced fee at time of service.  [facility] of [facility]  [address], [place] [phone] Accepts children up to age 99 who are enrolled in [place] or [place] Health Choice; pregnant women with a Medicaid card; and children who have applied for Medicaid or [place] Health Choice, but were declined, whose parents can pay a reduced fee at time of service.  [facility]  [address] [place], [place] [phone] Patients are seen by appointment only. Walk-ins are not accepted. [facility] will see patients 59 years of age and older. Monday - Tuesday (8am-5pm) Most Wednesdays (8:30-5pm) $30 per visit, cash only  [facility] Adult Dental Access PROGRAM  [address], [facility] [phone] Patients are seen by appointment only. Walk-ins are not accepted. [facility] will see patients 49 years of age and older. One Wednesday Evening (Monthly: Volunteer Based).  $30 per visit, cash only  [employer]  [phone] for adults; Children under age 40, call Graduate Pediatric Dentistry at [phone]. Children aged 47-14, please call [phone] to request a pediatric application.  Dental  services are provided in all areas of dental care including fillings, crowns and bridges, complete and partial dentures, implants, gum treatment, root canals, and extractions. Preventive care is also provided. Treatment is provided to both adults and children. Patients are selected via a lottery and there is often a waiting list.   [facility] [address], [place]  [phone] [email or web address]   [facility] [address] [place], [place] [phone], Ext. [phone] Second and Fourth Thursday of each month, opens at 6:30 AM; Clinic ends at 9 AM.  Patients are seen on a first-come first-served basis, and a limited number are seen during each clinic.   [facility]  [address] [place], [place] [phone]   Eligibility Requirements You must have lived in [place], [place], or [place] counties for at least the last three months.   You cannot be eligible for state or federal sponsored [place], including [employer], [place], or [employer].   You generally cannot be eligible for healthcare insurance through your employer.    How to apply: Eligibility screenings are held every Tuesday and Wednesday afternoon from 1:00 pm until 4:00 pm. You do not need an appointment for the interview!  [facility] [address], [place], [place] [phone]   [facility] Health Department  [phone]   [facility] Health Department  [phone]   [facility] Health Department  [phone]    Behavioral Health Resources in the Community: Intensive Outpatient Programs Organization         Address  Phone  Notes  [facility] [address] [place], [place] [phone]   [facility] Outpatient [address] [place], [place] [phone]   [facility] [address], [place], [place]  [phone]   [facility] [address]    [place], [place] [phone] or [phone]   Substance Abuse Resources Organization         Address  Phone  Notes  [facility] Drug Services  [phone]   [facility]  [phone]   [facility] [place]  [phone]   Floydene Flock  [phone]   Residential & Outpatient Substance Abuse Program  [phone]   Psychological Services Organization         Address  Phone  Notes  [facility]  [phone]   [facility] Services  [phone]   [facility] [address] [place] [phone] or [phone]    Mobile Crisis Teams Organization         Address  Phone  Notes  [facility], Mobile Crisis Care Unit  [phone]   [facility]  [address] [place], [place] [phone]   Doristine Locks [address] [place] [phone]    Self-Help/Support Groups Organization         Address  Phone             Notes  [facility] [place] - variety of support groups  [phone]- I7437963 Call for more information  Narcotics Anonymous (NA), [facility] [address], [employer] [place]  2 meetings at this location   Statistician         Address  Phone  Notes  [facility] [address],    [address] [place]  [phone]   [facility]  [address] [place] [address], [place], [place] [phone]   [facility] [address] [place], [place] [phone] Admissions: 8am-3pm M-F  [facility] [address]    [place], [place] [phone]   [facility] [address] [place], [place], [place] [phone]   [facility] [address]  [place], [place] [phone]   [facility] - Intensive Outpatient [address], [place], [place] [phone]   [facility] ([facility] [address] [place]  [place], [place]  [phone] or [phone]   [facility] ([facility]) [address], [place], [place] [phone] Accepts Medicaid  [facility] [place] [address]  [place] [phone] Substance Abuse/Addiction Treatment   [facility] Organization         Address  Phone  Notes  [facility]  [phone]   Angie Fava, PhD [address]vin Knack [place], [place]   [phone]) [phone] or [phone]   [facility]   [address] [place], [place] ([phone]   [facility] [address] [place], [place] [phone] Insurance/Medicaid/sponsorship through [facility] and Families [address]                                    [place], [place] [phone] Therapy/tele-psych/case  [facility] [address], [place] [phone]    Dr. Lolly Mustache  [phone]   [facility]  [employer] Assurance [facility] Dept. 1) [address] [place] [address], [place] 2) [address], [place] 3)  [address], [place] [phone]  [phone]   [facility] Child Abuse Hotline 989-736-9766 or 8023390293 (After Hours)

## 2014-02-25 NOTE — ED Notes (Signed)
Pt presents with right hand pain and swelling after falling on hand with a closed fist on Friday.  Pt admits to difficulty straightening fingers, obvious swelling noted to hand.

## 2014-03-01 ENCOUNTER — Encounter (HOSPITAL_COMMUNITY): Payer: Self-pay | Admitting: *Deleted

## 2014-03-01 ENCOUNTER — Emergency Department (HOSPITAL_COMMUNITY)
Admission: EM | Admit: 2014-03-01 | Discharge: 2014-03-01 | Disposition: A | Discharge status: Home / Self Care | Payer: Self-pay | Attending: Emergency Medicine | Admitting: Emergency Medicine

## 2014-03-01 DIAGNOSIS — X58XXXD Exposure to other specified factors, subsequent encounter: Secondary | ICD-10-CM | POA: Insufficient documentation

## 2014-03-01 DIAGNOSIS — S62309D Unspecified fracture of unspecified metacarpal bone, subsequent encounter for fracture with routine healing: Secondary | ICD-10-CM | POA: Insufficient documentation

## 2014-03-01 DIAGNOSIS — Z792 Long term (current) use of antibiotics: Secondary | ICD-10-CM | POA: Insufficient documentation

## 2014-03-01 DIAGNOSIS — Z720 Tobacco use: Secondary | ICD-10-CM | POA: Insufficient documentation

## 2014-03-01 DIAGNOSIS — Z79899 Other long term (current) drug therapy: Secondary | ICD-10-CM | POA: Insufficient documentation

## 2014-03-01 DIAGNOSIS — Z760 Encounter for issue of repeat prescription: Secondary | ICD-10-CM | POA: Insufficient documentation

## 2014-03-01 MED ORDER — OXYCODONE-ACETAMINOPHEN 10-325 MG PO TABS: 1.0000 | ORAL_TABLET | ORAL | Status: DC | PRN

## 2014-03-01 NOTE — ED Notes (Signed)
Pt. Was seen here Sunday for hand injury. Right arm is in sling and has bandage/splint applied to area. States he was taking pain medication as prescribed and it wasn't helping so he started taking 2 tablets at a time which helped but know he is out of pain medication.

## 2014-03-01 NOTE — ED Notes (Signed)
Pt seen here Sunday for shattered R hand.  D/t see hand surgeon on Monday but ran out of pain meds.

## 2014-03-01 NOTE — ED Provider Notes (Signed)
CSN: [id number]     Arrival date & time 03/01/14  1045 History  This chart was scribed for non-physician practitioner, Wynetta EmeryNicole Aliyanna Wassmer, PA-C, working with Tilden FossaElizabeth Rees, MD, by Ronney LionSuzanne Le, ED Scribe. This patient was seen in room TR09C/TR09C and the patient's care was started at 12:55 PM.    Chief Complaint  Patient presents with  . Hand Pain  . Medication Refill   The history is provided by the patient and medical records. No language interpreter was used.    HPI Comments: Greg Maldonado is a 33 y.o. male who presents to the Emergency Department for a medication refill for right arm pain. Per medical records, he was evaluated here 4 days ago and diagnosed with a right arm fracture. He has experienced normal sensation in his hand since. Patient is currently taking his medication and using some at a higher dose than was prescribed. He has an appointment at 3 PM 11/23 with a hand specialist.   History reviewed. No pertinent past medical history. History reviewed. No pertinent past surgical history. No family history on file. History  Substance Use Topics  . Smoking status: Current Every Day Smoker -- 3.00 packs/day  . Smokeless tobacco: Not on file  . Alcohol Use: 1.2 oz/week    2 Cans of beer per week    Review of Systems  All other systems reviewed and are negative.   A complete 10 system review of systems was obtained and all systems are negative except as noted in the HPI and PMH.    Allergies  Review of patient's allergies indicates no known allergies.  Home Medications   Prior to Admission medications   Medication Sig Start Date End Date Taking? Authorizing Provider  acetaminophen (TYLENOL) 500 MG tablet Take 1,000 mg by mouth every 6 (six) hours as needed for pain.     Historical Provider, MD  cephALEXin (KEFLEX) 500 MG capsule Take 1 capsule (500 mg total) by mouth 4 (four) times daily. 08/15/12   Arman FilterGail K Schulz, NP  ibuprofen (ADVIL,MOTRIN) 200 MG tablet Take 4 tablets  (800 mg total) by mouth once. 10/02/13   Fayrene HelperBowie Tran, PA-C  methocarbamol (ROBAXIN) 500 MG tablet Take 1 tablet (500 mg total) by mouth 2 (two) times daily. 10/02/13   Fayrene HelperBowie Tran, PA-C  Multiple Vitamin (MULTIVITAMIN WITH MINERALS) TABS Take 1 tablet by mouth daily.    Historical Provider, MD  oxyCODONE-acetaminophen (PERCOCET) 10-325 MG per tablet Take 1 tablet by mouth every 4 (four) hours as needed for pain. 03/01/14   Tywanna Seifer, PA-C   BP 120/70 mmHg  Pulse 81  Temp(Src) 98.3 F (36.8 C) (Oral)  Resp 12  SpO2 96% Physical Exam  Constitutional: He is oriented to person, place, and time. He appears well-developed and well-nourished. No distress.  HENT:  Head: Normocephalic and atraumatic.  Mouth/Throat: Oropharynx is clear and moist.  Eyes: Conjunctivae and EOM are normal. Pupils are equal, round, and reactive to light.  Neck: Normal range of motion.  Cardiovascular: Normal rate and regular rhythm.   Pulmonary/Chest: Effort normal and breath sounds normal. No stridor. No respiratory distress. He has no wheezes. He has no rales. He exhibits no tenderness.  Abdominal: Soft. Bowel sounds are normal. He exhibits no distension and no mass. There is no tenderness. There is no rebound and no guarding.  Musculoskeletal: Normal range of motion.  Right hand in ulnar gutter splint and sling, excellent range of motion to the fingers, Refill is brisk, distal sensation is intact.  Neurological: He is alert and oriented to person, place, and time.  Psychiatric: He has a normal mood and affect.  Nursing note and vitals reviewed.   ED Course  Procedures (including critical care time)  DIAGNOSTIC STUDIES: Oxygen Saturation is 97% on RA, normal by my interpretation.    COORDINATION OF CARE: 12:56 PM - Discussed treatment plan with pt at bedside which includes prescription for pain medication and pt agreed to plan.  Labs Review Labs Reviewed - No data to display  Imaging Review No results  found.   EKG Interpretation None      MDM   Final diagnoses:  Boxer's fracture, with routine healing, subsequent encounter   Filed Vitals:   03/01/14 1132 03/01/14 1311  BP: 128/74 120/70  Pulse: 82 81  Temp: 98.1 F (36.7 C) 98.3 F (36.8 C)  TempSrc: Oral Oral  Resp: 16 12  SpO2: 97% 96%    Greg Maldonado is a 33 y.o. male requesting more pain medication until he can be followed with hand surgery Dr. Zella Ballworkman. Patient is neurovascularly intact. Has appointment in 3 days. Will write for oxycodone 10 mg. Return precautions discussed.  Evaluation does not show pathology that would require ongoing emergent intervention or inpatient treatment. Pt is hemodynamically stable and mentating appropriately. Discussed findings and plan with patient/guardian, who agrees with care plan. All questions answered. Return precautions discussed and outpatient follow up given.   Discharge Medication List as of 03/01/2014  1:01 PM    START taking these medications   Details  oxyCODONE-acetaminophen (PERCOCET) 10-325 MG per tablet Take 1 tablet by mouth every 4 (four) hours as needed for pain., Starting 03/01/2014, Until Discontinued, Print         I personally performed the services described in this documentation, which was scribed in my presence. The recorded information has been reviewed and is accurate.    Wynetta Emeryicole Maritssa Haughton, PA-C 03/01/14 1648  Tilden FossaElizabeth Rees, MD 03/01/14 [phone]

## 2014-03-01 NOTE — Discharge Instructions (Signed)
Rest, Ice intermittently (in the first 24-48 hours), Gentle compression with an Ace wrap, and elevate (Limb above the level of the heart)   Take up to 800mg  of ibuprofen (that is usually 4 over the counter pills)  3 times a day for 5 days. Take with food.  Take percocet for breakthrough pain, do not drink alcohol, drive, care for children or do other critical tasks while taking percocet.  Do not hesitate to return to the emergency room for any new, worsening or concerning symptoms.  Please obtain primary care using resource guide below. But the minute you were seen in the emergency room and that they will need to obtain records for further outpatient management.

## 2014-03-05 ENCOUNTER — Encounter (HOSPITAL_COMMUNITY): Payer: Self-pay | Admitting: Emergency Medicine

## 2014-03-05 ENCOUNTER — Emergency Department (HOSPITAL_COMMUNITY)
Admission: EM | Admit: 2014-03-05 | Discharge: 2014-03-05 | Disposition: A | Discharge status: Home / Self Care | Payer: Self-pay | Attending: Emergency Medicine | Admitting: Emergency Medicine

## 2014-03-05 DIAGNOSIS — Z79899 Other long term (current) drug therapy: Secondary | ICD-10-CM | POA: Insufficient documentation

## 2014-03-05 DIAGNOSIS — Z792 Long term (current) use of antibiotics: Secondary | ICD-10-CM | POA: Insufficient documentation

## 2014-03-05 DIAGNOSIS — L02411 Cutaneous abscess of right axilla: Secondary | ICD-10-CM | POA: Insufficient documentation

## 2014-03-05 DIAGNOSIS — Z720 Tobacco use: Secondary | ICD-10-CM | POA: Insufficient documentation

## 2014-03-05 MED ORDER — IBUPROFEN 800 MG PO TABS: 800.0000 mg | ORAL_TABLET | Freq: Three times a day (TID) | ORAL | Status: DC | PRN

## 2014-03-05 MED ORDER — LIDOCAINE HCL (PF) 1 % IJ SOLN
5.0000 mL | Freq: Once | INTRAMUSCULAR | Status: AC
  Administered 2014-03-05: 5 mL via INTRADERMAL
  Filled 2014-03-05: qty 5

## 2014-03-05 MED ORDER — IBUPROFEN 800 MG PO TABS: 800.0000 mg | ORAL_TABLET | Freq: Three times a day (TID) | ORAL | Status: DC

## 2014-03-05 NOTE — ED Notes (Signed)
Pt. reports abscess at right axilla with drainage onset yesterday .

## 2014-03-05 NOTE — ED Provider Notes (Signed)
CSN: [id number]     Arrival date & time 03/05/14  2211 History  This chart was scribed for non-physician practitioner, Dierdre Forth, PA-C, working with Gerhard Munch, MD, by Modena Jansky, ED Scribe. This patient was seen in room TR05C/TR05C and the patient's care was started at 10:43 PM.  Chief Complaint  Patient presents with  . Abscess   The history is provided by the patient and medical records. No language interpreter was used.   HPI Comments: Greg Maldonado is a 33 y.o. male who presents to the Emergency Department complaining of an abscess that started yesterday. He reports that he has an abscess on his right axillary area. He states there's been some drainage from the site both yesterday and today, serosanguineous and purulent. He denies any hx of DM or IV drug use. He also denies any fever, chills, nausea, or diarrhea. 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 Treatments prior to arrival. No aggravating or alleviating factors.  Patient denies history of diabetes, HIV or steroid usage.  Patient denies MRSA history.  History reviewed. No pertinent past medical history. History reviewed. No pertinent past surgical history. No family history on file. History  Substance Use Topics  . Smoking status: Current Every Day Smoker -- 3.00 packs/day  . Smokeless tobacco: Not on file  . Alcohol Use: 1.2 oz/week    2 Cans of beer per week    Review of Systems  Constitutional: Negative for fever and chills.  Gastrointestinal: Negative for nausea, vomiting and diarrhea.  Skin: Positive for color change.  Allergic/Immunologic: Negative for immunocompromised state.  Hematological: Does not bruise/bleed easily.  Psychiatric/Behavioral: The patient is not nervous/anxious.   All other systems reviewed and are negative.   Allergies  Review of patient's allergies indicates no known allergies.  Home Medications   Prior to Admission  medications   Medication Sig Start Date End Date Taking? Authorizing Provider  acetaminophen (TYLENOL) 500 MG tablet Take 1,000 mg by mouth every 6 (six) hours as needed for pain.     Historical Provider, MD  cephALEXin (KEFLEX) 500 MG capsule Take 1 capsule (500 mg total) by mouth 4 (four) times daily. 08/15/12   Arman Filter, NP  ibuprofen (ADVIL,MOTRIN) 200 MG tablet Take 4 tablets (800 mg total) by mouth once. 10/02/13   Fayrene Helper, PA-C  ibuprofen (ADVIL,MOTRIN) 800 MG tablet Take 1 tablet (800 mg total) by mouth 3 (three) times daily as needed for mild pain or moderate pain. 03/05/14   Murice Barbar, PA-C  methocarbamol (ROBAXIN) 500 MG tablet Take 1 tablet (500 mg total) by mouth 2 (two) times daily. 10/02/13   Fayrene Helper, PA-C  Multiple Vitamin (MULTIVITAMIN WITH MINERALS) TABS Take 1 tablet by mouth daily.    Historical Provider, MD  oxyCODONE-acetaminophen (PERCOCET) 10-325 MG per tablet Take 1 tablet by mouth every 4 (four) hours as needed for pain. 03/01/14   Nicole Pisciotta, PA-C   BP 134/82 mmHg  Pulse 94  Temp(Src) 97.9 F (36.6 C) (Oral)  Resp 18  SpO2 96% Physical Exam  Constitutional: He is oriented to person, place, and time. He appears well-developed and well-nourished. No distress.  HENT:  Head: Normocephalic and atraumatic.  Eyes: Conjunctivae are normal. No scleral icterus.  Neck: Normal range of motion.  Cardiovascular: Normal rate, regular rhythm, normal heart sounds and intact distal pulses.   No murmur heard. No Tachycardia  Pulmonary/Chest: Effort normal and breath sounds normal.  Abdominal: Soft. He exhibits  no distension. There is no tenderness.  Lymphadenopathy:    He has no cervical adenopathy.  Neurological: He is alert and oriented to person, place, and time.  Skin: Skin is warm and dry. He is not diaphoretic. There is erythema.  4 x 4 centimeter area of induration and erythema to the right axilla with centralized fluctuance  Psychiatric: He has a  normal mood and affect.  Nursing note and vitals reviewed.   ED Course  INCISION AND DRAINAGE Date/Time: 03/05/2014 11:31 PM Performed by: Dierdre ForthMUTHERSBAUGH, Emeka Lindner Authorized by: Dierdre ForthMUTHERSBAUGH, Betzaira Mentel Consent: Verbal consent obtained. Risks and benefits: risks, benefits and alternatives were discussed Consent given by: patient Patient understanding: patient states understanding of the procedure being performed Patient consent: the patient's understanding of the procedure matches consent given Procedure consent: procedure consent matches procedure scheduled Relevant documents: relevant documents present and verified Site marked: the operative site was marked Imaging studies: imaging studies available (Bedside ultrasound) Required items: required blood products, implants, devices, and special equipment available Patient identity confirmed: verbally with patient and arm band Time out: Immediately prior to procedure a "time out" was called to verify the correct patient, procedure, equipment, support staff and site/side marked as required. Type: abscess Location: Right axilla. Anesthesia: local infiltration Local anesthetic: lidocaine 1% without epinephrine Anesthetic total: 3.5 ml Patient sedated: no Scalpel size: 11 Incision type: single straight Complexity: complex Drainage: purulent Drainage amount: moderate Wound treatment: wound left open Packing material: 1/4 in iodoform gauze Patient tolerance: Patient tolerated the procedure well with no immediate complications   (including critical care time) DIAGNOSTIC STUDIES: Oxygen Saturation is 96% on RA, normal by my interpretation.    COORDINATION OF CARE: 10:47 PM- Pt advised of plan for treatment which includes medication and pt agrees.  Labs Review Labs Reviewed - No data to display  Imaging Review No results found.   EKG Interpretation None      EMERGENCY DEPARTMENT US SOFT TISSUE INTERPRETATION "Study: Limited  Ultrasound of the noted body part in comments below"  INDICATIONS: Pain and Soft tissue infection Multiple views of the body part are obtained with a multi-frequency linear probe  PERFORMED BY:  Myself  IMAGES ARCHIVED?: Yes  SIDE:Right   BODY PART:Axilla  FINDINGS: Abcess present and Cellulitis present  LIMITATIONS:  Body Habitus  INTERPRETATION:  Abcess present and Cellulitis present  COMMENT:  Small abscess present with cystic material in the fluid pocket, mild surrounding cellulitis    MDM   Final diagnoses:  Abscess of right axilla   Maricus Millette resents with right axilla abscess, verified with bedside ultrasound.  Patient with skin abscess amenable to incision and drainage.  Abscess was large enough to warrant packing,  wound recheck in 2 days. Encouraged home warm soaks and flushing.  Mild signs of cellulitis is surrounding skin.  Will d/c to home.  No antibiotic therapy is indicated.  Patient returned emergency department in 48 hours for wound check.  I have personally reviewed patient's vitals, nursing note and any pertinent labs or imaging.  I performed an focused physical exam; undressed when appropriate .    It has been determined that no acute conditions requiring further emergency intervention are present at this time. The patient/guardian have been advised of the diagnosis and plan. I reviewed any labs and imaging including any potential incidental findings. We have discussed signs and symptoms that warrant return to the ED and they are listed in the discharge instructions.    Vital signs are stable at discharge.  BP 134/82 mmHg  Pulse 94  Temp(Src) 97.9 F (36.6 C) (Oral)  Resp 18  SpO2 96%   I personally performed the services described in this documentation, which was scribed in my presence. The recorded information has been reviewed and is accurate.    Dahlia ClientHannah Tylasia Fletchall, PA-C 03/05/14 [id number]  Gerhard Munchobert Lockwood, MD 03/06/14 [phone]

## 2014-03-05 NOTE — ED Notes (Signed)
Suture cart present at the bedside.  

## 2014-03-05 NOTE — Discharge Instructions (Signed)
1. Medications: ibuprofen for pain, usual home medications 2. Treatment: rest, drink plenty of fluids, warm compresses 3. Follow Up: Please followup with here in the ED in 2 days for discussion of your diagnoses and further evaluation after today's visit; if you do not have a primary care doctor use the resource guide provided to find one; Please return to the ER for fever, chills, worsening symptoms    Absceso (Abscess)  Un absceso es una zona infectada que contiene pus y desechos.Puede aparecer en cualquier parte del cuerpo. Tambin se lo conoce como fornculo o divieso. CAUSAS  Ocurre cuando los tejidos se infectan. Tambin puede formarse por obstruccin de las glndulas sebceas o las glndulas sudorparas, infeccin de los folculos pilosos o por una lesin pequea en la piel. A medida que el organismo lucha contra la infeccin, se acumula pus en la zona y hace presin debajo de la piel. Esta presin causa dolor. Las personas con un sistema inmunolgico debilitado tienen dificultad para Industrial/product designerluchar contra las infecciones y pueden formar abscesos con ms frecuencia.  SNTOMAS  Generalmente un absceso se forma sobre la piel y se vuelve una masa dolorosa, roja, caliente y sensible. Si se forma debajo de la piel, podr sentir como una zona blanda, que se Roncomueve, debajo de la piel. Algunos abscesos se abren (ruptura) por s mismos, pero la mayora seguir empeorando si no se lo trata. La infeccin puede diseminarse hacia otros sitios del cuerpo y finalmente al torrente sanguneo y hace que el enfermo se sienta mal.  DIAGNSTICO  El mdico le har una historia clnica y un examen fsico. Podrn tomarle Lauris Poaguna muestra de lquido del absceso y Public librariananalizarlo para Clinical research associateencontrar la causa de la infeccin. .  TRATAMIENTO  El mdico le indicar antibiticos para combatir la infeccin. Sin embargo, el uso de antibiticos solamente no curar el absceso. El mdico tendr que hacer un pequeo corte (incisin) en el absceso para  drenar el pus. En algunos casos se introduce una gasa en el absceso para reducir Chief Technology Officerel dolor y que siga drenando la zona.  INSTRUCCIONES PARA EL CUIDADO EN EL HOGAR   Solo tome medicamentos de venta libre o recetados para Chief Technology Officerel dolor, Dentistmalestar o fiebre, segn las indicaciones del mdico.  Si le han recetado antibiticos, tmelos segn las indicaciones. Tmelos todos, aunque se sienta mejor.  Si le aplicaron una gasa, siga las indicaciones del mdico para Nigeriacambiarla.  Para evitar la propagacin de la infeccin:  Mantenga el absceso cubierto con el vendaje.  Lvese bien las manos.  No comparta artculos de cuidado personal, toallas o jacuzzis con los dems.  Evite el contacto con la piel de Economistotras personas.  Mantenga la piel y la ropa limpia alrededor del absceso.  Cumpla con todas las visitas de control, segn le indique su mdico. SOLICITE ATENCIN MDICA SI:   Aumenta el dolor, la hinchazn, el enrojecimiento, drena lquido o sangra.  Siente dolores musculares, escalofros, o una sensacin general de Dentistmalestar.  Tiene fiebre. ASEGRESE DE QUE:   Comprende estas instrucciones.  Controlar su enfermedad.  Solicitar ayuda de inmediato si no mejora o si empeora. Document Released: 03/30/2005 Document Revised: 09/29/2011 [employer] Patient Information 2015 [employer] [place]. This information is not intended to replace advice given to you by your health care provider. Make sure you discuss any questions you have with your health care provider.

## 2014-05-01 ENCOUNTER — Emergency Department (HOSPITAL_COMMUNITY): Payer: Self-pay

## 2014-05-01 ENCOUNTER — Encounter (HOSPITAL_COMMUNITY): Payer: Self-pay | Admitting: Emergency Medicine

## 2014-05-01 ENCOUNTER — Emergency Department (HOSPITAL_COMMUNITY)
Admission: EM | Admit: 2014-05-01 | Discharge: 2014-05-01 | Disposition: A | Discharge status: Home / Self Care | Payer: Self-pay | Attending: Emergency Medicine | Admitting: Emergency Medicine

## 2014-05-01 DIAGNOSIS — W208XXA Other cause of strike by thrown, projected or falling object, initial encounter: Secondary | ICD-10-CM | POA: Insufficient documentation

## 2014-05-01 DIAGNOSIS — Y9389 Activity, other specified: Secondary | ICD-10-CM | POA: Insufficient documentation

## 2014-05-01 DIAGNOSIS — R609 Edema, unspecified: Secondary | ICD-10-CM

## 2014-05-01 DIAGNOSIS — Y990 Civilian activity done for income or pay: Secondary | ICD-10-CM | POA: Insufficient documentation

## 2014-05-01 DIAGNOSIS — Y9289 Other specified places as the place of occurrence of the external cause: Secondary | ICD-10-CM | POA: Insufficient documentation

## 2014-05-01 DIAGNOSIS — R52 Pain, unspecified: Secondary | ICD-10-CM

## 2014-05-01 DIAGNOSIS — T1490XA Injury, unspecified, initial encounter: Secondary | ICD-10-CM

## 2014-05-01 DIAGNOSIS — S6992XA Unspecified injury of left wrist, hand and finger(s), initial encounter: Secondary | ICD-10-CM | POA: Insufficient documentation

## 2014-05-01 DIAGNOSIS — Z720 Tobacco use: Secondary | ICD-10-CM | POA: Insufficient documentation

## 2014-05-01 MED ORDER — HYDROCODONE-ACETAMINOPHEN 5-325 MG PO TABS: 1.0000 | ORAL_TABLET | ORAL | Status: DC | PRN

## 2014-05-01 MED ORDER — NAPROXEN 500 MG PO TABS: 500.0000 mg | ORAL_TABLET | Freq: Two times a day (BID) | ORAL | Status: DC

## 2014-05-01 NOTE — ED Provider Notes (Signed)
CSN: [id number]     Arrival date & time 05/01/14  2104 History  This chart was scribed for non-physician practitioner, Sharilyn SitesLisa Nahum Sherrer, PA-C working with Arby BarretteMarcy Pfeiffer, MD by Greggory StallionKayla Andersen, ED scribe. This patient was seen in room TR07C/TR07C and the patient's care was started at 9:29 PM.   Chief Complaint  Patient presents with  . Finger Injury   The history is provided by the patient. No language interpreter was used.    HPI Comments: Greg Maldonado is a 34 y.o. male who presents to the Emergency Department complaining of left ring finger injury that occurred yesterday. States he was trying to install drywall at work when a piece fell onto his finger. Reports sudden onset pain with associated swelling. He has tried to remove his ring but was unable to. States his finger has started to become numb and tingly because he can't remove it.   History reviewed. No pertinent past medical history. History reviewed. No pertinent past surgical history. No family history on file. History  Substance Use Topics  . Smoking status: Current Every Day Smoker -- 3.00 packs/day  . Smokeless tobacco: Not on file  . Alcohol Use: 1.2 oz/week    2 Cans of beer per week    Review of Systems  Musculoskeletal: Positive for joint swelling and arthralgias.  All other systems reviewed and are negative.  Allergies  Review of patient's allergies indicates no known allergies.  Home Medications   Prior to Admission medications   Medication Sig Start Date End Date Taking? Authorizing Provider  ibuprofen (ADVIL,MOTRIN) 200 MG tablet Take 4 tablets (800 mg total) by mouth once. Patient taking differently: Take 800 mg by mouth every 6 (six) hours as needed for moderate pain.  10/02/13  Yes Fayrene HelperBowie Tran, PA-C  acetaminophen (TYLENOL) 500 MG tablet Take 1,000 mg by mouth every 6 (six) hours as needed for pain.     Historical Provider, MD  cephALEXin (KEFLEX) 500 MG capsule Take 1 capsule (500 mg total) by mouth 4 (four)  times daily. Patient not taking: Reported on 05/01/2014 08/15/12   Arman FilterGail K Schulz, NP  ibuprofen (ADVIL,MOTRIN) 800 MG tablet Take 1 tablet (800 mg total) by mouth 3 (three) times daily as needed for mild pain or moderate pain. Patient not taking: Reported on 05/01/2014 03/05/14   Dahlia ClientHannah Muthersbaugh, PA-C  methocarbamol (ROBAXIN) 500 MG tablet Take 1 tablet (500 mg total) by mouth 2 (two) times daily. Patient not taking: Reported on 05/01/2014 10/02/13   Fayrene HelperBowie Tran, PA-C  oxyCODONE-acetaminophen (PERCOCET) 10-325 MG per tablet Take 1 tablet by mouth every 4 (four) hours as needed for pain. Patient not taking: Reported on 05/01/2014 03/01/14   Joni ReiningNicole Pisciotta, PA-C   BP 134/72 mmHg  Pulse 90  Temp(Src) 98.2 F (36.8 C) (Oral)  Resp 16  Ht 5\' 7"  (1.702 m)  Wt 180 lb (81.647 kg)  BMI 28.19 kg/m2  SpO2 98%   Physical Exam  Constitutional: He is oriented to person, place, and time. He appears well-developed and well-nourished. No distress.  HENT:  Head: Normocephalic and atraumatic.  Mouth/Throat: Oropharynx is clear and moist.  Eyes: Conjunctivae and EOM are normal. Pupils are equal, round, and reactive to light.  Neck: Normal range of motion. Neck supple.  Cardiovascular: Normal rate, regular rhythm and normal heart sounds.   Pulmonary/Chest: Effort normal and breath sounds normal. No respiratory distress. He has no wheezes.  Musculoskeletal: Normal range of motion.  Left ring finger diffusely swollen without bony deformity; full flexion/extension  maintained; normal cap refill; sensation intact  Neurological: He is alert and oriented to person, place, and time.  Skin: Skin is warm and dry. He is not diaphoretic.  Psychiatric: He has a normal mood and affect.  Nursing note and vitals reviewed.   ED Course  FOREIGN BODY REMOVAL Date/Time: 05/01/2014 11:03 PM Performed by: Garlon Hatchet Authorized by: Garlon Hatchet Consent: Verbal consent obtained. Risks and benefits: risks, benefits  and alternatives were discussed Consent given by: patient Patient understanding: patient states understanding of the procedure being performed Required items: required blood products, implants, devices, and special equipment available Patient identity confirmed: verbally with patient Body area: skin General location: upper extremity Location details: left ring finger Patient sedated: no Patient restrained: no Localization method: visualized Tendon involvement: none Complexity: simple 1 objects recovered. Objects recovered: ring Post-procedure assessment: foreign body removed Patient tolerance: Patient tolerated the procedure well with no immediate complications   (including critical care time)  DIAGNOSTIC STUDIES: Oxygen Saturation is 98% on RA, normal by my interpretation.    COORDINATION OF CARE: 9:31 PM-Discussed treatment plan which includes xray and cutting ring off with pt at bedside and pt agreed to plan.   Labs Review Labs Reviewed - No data to display  Imaging Review Dg Finger Ring Left  05/01/2014   CLINICAL DATA:  Dry wall fell on hand, crush injury.  EXAM: LEFT RING FINGER 2+V  COMPARISON:  None.  FINDINGS: There is no evidence of fracture or dislocation. There is no evidence of arthropathy or other focal bone abnormality. Focal dorsal PIP soft tissue swelling without subcutaneous gas or radiopaque foreign bodies.  IMPRESSION: Soft tissue swelling without fracture deformity or dislocation.   Electronically Signed   By: Awilda Metro   On: 05/01/2014 22:35     EKG Interpretation None      MDM   Final diagnoses:  Injury of left ring finger, initial encounter   Wedding band cut off with improvement of paresthesias.  Hand remains NVI, normal blood flow.  Imaging negative for acute fx.  Finger splint applied.  Rx naprosyn, vicodin.  Encouraged RICE routine.  Discussed plan with patient, he/she acknowledged understanding and agreed with plan of care.  Return  precautions given for new or worsening symptoms.  I personally performed the services described in this documentation, which was scribed in my presence. The recorded information has been reviewed and is accurate.  Garlon Hatchet, PA-C 05/01/14 0981  Arby Barrette, MD 05/02/14 [phone]

## 2014-05-01 NOTE — ED Notes (Signed)
Pt. presents with left ring finger pain/ swelling injured yesterday at work while trying to install a dry wall .

## 2014-05-01 NOTE — ED Notes (Signed)
Pt made aware to return if symptoms worsen or if any life threatening symptoms occur.   

## 2014-05-01 NOTE — Discharge Instructions (Signed)
Take the prescribed medication as directed. °Return to the ED for new or worsening symptoms. ° °

## 2014-05-30 ENCOUNTER — Emergency Department (HOSPITAL_COMMUNITY)
Admission: EM | Admit: 2014-05-30 | Discharge: 2014-05-31 | Disposition: A | Discharge status: Home / Self Care | Payer: Self-pay | Attending: Emergency Medicine | Admitting: Emergency Medicine

## 2014-05-30 ENCOUNTER — Encounter (HOSPITAL_COMMUNITY): Payer: Self-pay | Admitting: Emergency Medicine

## 2014-05-30 DIAGNOSIS — L509 Urticaria, unspecified: Secondary | ICD-10-CM | POA: Insufficient documentation

## 2014-05-30 DIAGNOSIS — Z720 Tobacco use: Secondary | ICD-10-CM | POA: Insufficient documentation

## 2014-05-30 DIAGNOSIS — J029 Acute pharyngitis, unspecified: Secondary | ICD-10-CM | POA: Insufficient documentation

## 2014-05-30 LAB — RAPID STREP SCREEN (MED CTR MEBANE ONLY): Streptococcus, Group A Screen (Direct): NEGATIVE

## 2014-05-30 LAB — I-STAT CHEM 8, ED
BUN: 22 mg/dL (ref 6–23)
CREATININE: 1 mg/dL (ref 0.50–1.35)
Calcium, Ion: 1.19 mmol/L (ref 1.12–1.23)
Chloride: 104 mmol/L (ref 96–112)
Glucose, Bld: 106 mg/dL — ABNORMAL HIGH (ref 70–99)
HCT: 45 % (ref 39.0–52.0)
Hemoglobin: 15.3 g/dL (ref 13.0–17.0)
Potassium: 3.6 mmol/L (ref 3.5–5.1)
Sodium: 139 mmol/L (ref 135–145)
TCO2: 23 mmol/L (ref 0–100)

## 2014-05-30 MED ORDER — FAMOTIDINE 20 MG PO TABS: 20.0000 mg | ORAL_TABLET | Freq: Two times a day (BID) | ORAL | Status: DC

## 2014-05-30 MED ORDER — FAMOTIDINE IN NACL 20-0.9 MG/50ML-% IV SOLN
20.0000 mg | Freq: Once | INTRAVENOUS | Status: AC
  Administered 2014-05-30: 20 mg via INTRAVENOUS
  Filled 2014-05-30: qty 50

## 2014-05-30 MED ORDER — DIPHENHYDRAMINE HCL 50 MG/ML IJ SOLN
25.0000 mg | Freq: Once | INTRAMUSCULAR | Status: AC
  Administered 2014-05-30: 25 mg via INTRAVENOUS
  Filled 2014-05-30: qty 1

## 2014-05-30 MED ORDER — METHYLPREDNISOLONE SODIUM SUCC 125 MG IJ SOLR
125.0000 mg | Freq: Once | INTRAMUSCULAR | Status: AC
  Administered 2014-05-30: 125 mg via INTRAVENOUS
  Filled 2014-05-30: qty 2

## 2014-05-30 MED ORDER — PREDNISONE 50 MG PO TABS: ORAL_TABLET | ORAL | Status: DC

## 2014-05-30 MED ORDER — SODIUM CHLORIDE 0.9 % IV BOLUS (SEPSIS)
1000.0000 mL | Freq: Once | INTRAVENOUS | Status: AC
  Administered 2014-05-30: 1000 mL via INTRAVENOUS

## 2014-05-30 NOTE — ED Notes (Signed)
Pt c/o hives and itching since Saturday night. Reports taking OTC cream and benadryl at home without relief. Pt also c/o sorethroat since this morning. Denies shortness of breath; lung sounds clear.

## 2014-05-30 NOTE — Discharge Instructions (Signed)
1 to 2 tablets of 25 mg Benadryl pills every 4-6 hours as needed to a maximum of 300 mg per day. In addition, you may apply a topical hydrocortisone ointment to all affected areas except for the face.   Do not hesitate to call 911 or return to the emergency room if you develop any shortness of breath, wheezing, tongue or lip swelling.  Do not hesitate to return to the emergency room for any new, worsening or concerning symptoms.  Please obtain primary care using resource guide below. But the minute you were seen in the emergency room and that they will need to obtain records for further outpatient management.   Ronchas  (Hives)  Las ronchas son reas de la piel inflamadas (hinchadas) rojas y que pican. Pueden cambiar de tamao y de ubicacin en el cuerpo. Las Armed forces operational officer y Geneticist, molecular durante algunas horas o das (ronchas agudas) o durante algunas semanas (ronchas crnicas). No pueden transmitirse de [place] persona a Theodoro Clock (no son contagiosas). Pueden empeorar al rascarse, hacer ejercicios y por estrs emocional.  CAUSAS   Reaccin alrgica a alimentos, aditivos o frmacos.  Infecciones, incluso el resfro comn.  Enfermedades, como la vasculitis, el lupus o la enfermedad tiroidea.  Exposicin al sol, al calor o al fro.  La prctica de ejercicios.  El estrs.  El contacto con algunas sustancias qumicas. SNTOMAS   Zonas hinchadas, rojas o blancas, sobre la piel. Las ronchas pueden cambiar de [place], forma, [place] y Armed forces logistics/support/administrative officer.  Picazn.  Hinchazn de las [employer] y [place]. Esto puede ocurrir si las ronchas se desarrollan en capas profundas de la piel. DIAGNSTICO  El mdico puede diagnosticar el problema haciendo un examen fsico. Conley Rolls indicar anlisis de sangre o un estudio de la piel para Production assistant, radio causa. En algunos casos, no puede determinarse la causa.  TRATAMIENTO  Los casos leves generalmente mejoran con medicamentos como los  antihistamnicos. Los casos ms graves pueden requerir una inyeccin de epinefrina de Associate Professor. Si se conoce la causa de la urticaria, el tratamiento incluye evitar el factor desencadenante.  INSTRUCCIONES PARA EL CUIDADO EN EL HOGAR   Evite las causas que han desencadenado las ronchas.  Tome los antihistamnicos segn las indicaciones del mdico para reducir la gravedad de las ronchas. Generalmente se recomiendan los [employer] no son sedantes o con bajo efecto sedante. No conduzca vehculos mientras toma antihistamnicos.  Tome los medicamentos para la picazn exactamente como le indic el mdico.  Use ropas sueltas.  Cumpla con todas las visitas de control, segn le indique su mdico. SOLICITE ATENCIN MDICA SI:   Siente una picazn intensa o persistente que no se calma con los medicamentos.  Le duelen las articulaciones o estn inflamadas. SOLICITE ATENCIN MDICA DE INMEDIATO SI:   Tiene fiebre.  Tiene la boca o los labios hinchados.  Tiene problemas para respirar o tragar.  Siente una opresin en la garganta o en el pecho.  Siente dolor abdominal. Estos problemas pueden ser los primeros signos de una reaccin alrgica que ponga en peligro la vida. Llame a los servicios de emergencia locales (911 en los [place]). ASEGRESE DE QUE:   Comprende estas instrucciones.  Controlar su enfermedad.  Solicitar ayuda de inmediato si no mejora o si empeora. Document Released: 03/30/2005 Document Revised: 04/04/2013 [facility] Patient Information 2015 [place], [place]. This information is not intended to replace advice given to you by your health care provider. Make sure you discuss any questions you have with your  health care provider.   Emergency Department Resource Guide 1) Find a Doctor and Pay Out of Pocket Although you won't have to find out who is covered by your insurance plan, it is a good idea to ask around and get recommendations. You will then need to  call the office and see if the doctor you have chosen will accept you as a new patient and what types of options they offer for patients who are self-pay. Some doctors offer discounts or will set up payment plans for their patients who do not have insurance, but you will need to ask so you aren't surprised when you get to your appointment.  2) Contact Your Local Health Department Not all health departments have doctors that can see patients for sick visits, but many do, so it is worth a call to see if yours does. If you don't know where your local health department is, you can check in your phone book. The CDC also has a tool to help you locate your state's health department, and many state websites also have listings of all of their local health departments.  3) Find a Walk-in Clinic If your illness is not likely to be very severe or complicated, you may want to try a walk in clinic. These are popping up all over the country in pharmacies, drugstores, and shopping centers. They're usually staffed by nurse practitioners or physician assistants that have been trained to treat common illnesses and complaints. They're usually fairly quick and inexpensive. However, if you have serious medical issues or chronic medical problems, these are probably not your best option.  No Primary Care Doctor: - Call Health Connect at  [phone] - they can help you locate a primary care doctor that  accepts your insurance, provides certain services, etc. - Physician Referral Service- [phone]  Chronic Pain Problems: Organization         Address  Phone   Notes  [facility]  [phone] Patients need to be referred by their primary care doctor.   Medication Assistance: Organization         Address  Phone   Notes  [facility] Medication [facility] [address] [place] [place], [place] [address] [phone] --Must be a resident of [facility] -- Must have NO insurance  coverage whatsoever (no Medicaid/ Medicare, etc.) -- The pt. MUST have a primary care doctor that directs their care regularly and follows them in the community   [facility]  [phone]   [employer]  [phone]    Agencies that provide inexpensive medical care: Organization         Address  Phone   Notes  [facility]  [phone]   [facility]    [phone]   [facility] [address] [place] [place], [place] [address] [phone]   [facility] [address] [place] [place] [phone]   Planned Parenthood    [phone]   [facility]    [phone]   [facility]  [address], [place] Phone:  [phone], Fax:  [phone] Hours of Operation:  9 am - 6 pm, M-F.  Also accepts Medicaid/Medicare and self-pay.  [facility]  [address], [place] Phone: [phone], Fax: [phone]. Hours of Operation:  8:30 am - 5:30 pm, M-F.  Also accepts Medicaid and  self-pay.  [facility] [place] [address], [place] Phone: [phone]   [facility] [address] Natasha Bence [place], [place] [phone], Ext. [phone] Mondays & Thursdays: 7-9 AM.  First 15 patients are seen on a first come, first serve basis.    Medicaid-accepting [facility] Providers:  Organization         Address  Phone   Notes  [facility] [address], [place] [phone] Also accepts self-pay patients.  [facility] [address] Laurell Josephs [place], [place]  [phone]   [facility] [address], [place] [phone]   [facility] [address] [place] [phone]   Renaye Rakers [address], [place]   [phone] Only accepts [place] Access [place] patients after they have their  name applied to their card.   Self-Pay (no insurance) in [facility]:  Organization         Address  Phone   Notes  Sickle Cell Patients, [facility] [address] [place], [place] [phone]   [facility] [address] [place], [place] [phone]   [facility] [place]  [address], [place] [phone]   [facility]/Dr. Osei-Bonsu  [address] [place] or [address], [place] [phone] Phone number for both [place] and [place] locations is the same.  [facility] [address] [place] [phone]   [facility] [address] [place] or [address] [phone]   [facility] [address], [place] [phone], phone; [phone], fax Sees patients 1st and 3rd Saturday of every month.  Must not qualify for public or private insurance (i.e. Medicaid, Medicare, [place] Health Choice, Veterans' Benefits)  Household income should be no more than 200% of the poverty level The clinic cannot treat you if you are pregnant or think you are pregnant  Sexually transmitted diseases are not treated at the clinic.    Dental Care: Organization         Address  Phone  Notes  [facility] Department of [facility] [address] [place], [place] [phone] Accepts children up to age 33 who are enrolled in [place] or [place] Health Choice; pregnant women with a Medicaid card; and children who have applied for Medicaid or [place] Health Choice, but were declined, whose parents can pay a reduced fee at time of service.  [facility] Department of [facility]  [address], [place] [phone] Accepts children up to age 69 who are enrolled in [place] or [place] Health Choice; pregnant women with a Medicaid card; and children who have applied for  Medicaid or [place] Health Choice, but were declined, whose parents can pay a reduced fee at time of service.  [facility]  [address] [place], [place] [phone] Patients are seen by appointment only. Walk-ins are not accepted. [facility] will see patients 44 years of age and older. Monday - Tuesday (8am-5pm) Most Wednesdays (8:30-5pm) $30 per visit, cash only  [facility] Adult Dental Access PROGRAM  [address], [facility] [phone] Patients are seen by appointment only. Walk-ins are not accepted. [facility] will see patients 19  years of age and older. One Wednesday Evening (Monthly: Volunteer Based).  $30 per visit, cash only  [employer] [facility] [facility]  [phone] for adults; Children under age 24, call Graduate Pediatric Dentistry at [phone]. Children aged 314-14, please call [phone] to request a pediatric application.  Dental services are provided in all areas of dental care including fillings, crowns and bridges, complete and partial dentures, implants, gum treatment, root canals, and extractions. Preventive care is also provided. Treatment is provided to both adults and children. Patients are selected via a lottery and there is often a waiting list.   [facility] [address], [place]  [phone] [email or web address]   [facility] [address], [place], [place] [phone], Ext. [phone] Second and Fourth Thursday of each month, opens at 6:30 AM; Clinic ends at 9 AM.  Patients are seen on a first-come first-served basis, and a limited number are seen during each clinic.   [facility]  [address], [place], [place] [phone]   Eligibility Requirements You must have lived in [place], [place], or [place] counties for at least the last three months.   You cannot be eligible for state or federal sponsored National Cityhealthcare insurance, including CIGNAVeterans Administration, [place]icaid,  or Har[employer] EntertainmentMedicare.   You generally cannot be eligible for healthcare insurance through your employer.    How to apply: Eligibility screenings are held every Tuesday and Wednesday afternoon from 1:00 pm until 4:00 pm. You do not need an appointment for the interview!  [facility] [address], [place], [place] [phone]   [facility]  [phone]   [facility]  [phone]   [facility]  [phone]    Behavioral Health Resources in the Community: Intensive Outpatient Programs Organization         Address  Phone  Notes  [facility] [address], [place], [place] [phone]   [facility] [address], [place], [place] [phone]   [facility] [address], [place], [place]  [phone]   [facility] [address],  [place], [place] [phone] or [phone]   Substance Abuse Resources Organization         Address  Phone  Notes  [facility]  [phone]   [facility]  [phone]   [facility]  [phone]   [facility]  [phone]   Residential & Outpatient Substance Abuse Program  [phone]   Psychological Services Organization         Address  Phone  Notes  [facility]  [phone]   [facility]  [phone]- [phone]   [facility] [address], [place] [phone] or [phone]    Mobile Crisis Teams Organization         Address  Phone  Notes  [facility], Mobile Crisis Care Unit  [phone]   [facility]  [address] [place], [place] [phone]   Doristine LocksSharon DeEsch [address] [place] [phone]    Self-Help/Support Groups Organization         Address  Phone             Notes  [facility] [place] - variety of support groups   336- [phone] Call for more information  Narcotics Anonymous (NA), [facility] [address], [place]  2 meetings at this location   Residential Treatment Programs Organization         Address  Phone  Notes  [facility] [address]    [place]  [phone]   [facility]  [address], [place] [address], [place], [place] [phone]   [facility] [address] [place], [place] [phone] Admissions: 8am-3pm M-F  [facility] [address],    [place], [place] [phone]   [facility] [address] [place], [place], [place] [phone]   [facility] [address],  [place], [place] [phone]   [facility] - Intensive Outpatient [address]urell [address], [place], [place] [phone]   [facility] [address] [place]  [place], [place] [phone] or [phone]   [facility] ([facility]) [address], [place], [place] [phone] Accepts Medicaid  [facility] [place] [address],  [place] [phone] Substance Abuse/Addiction Treatment   [facility] Organization         Address  Phone  Notes  [facility]  [phone]   Angie Fava, PhD [address] Ervin Knack [place], [place]   [phone] or [phone]   [facility] Behavioral   [address] [place], [place] [phone]   [facility] [address], [place], [place] [phone] Insurance/Medicaid/sponsorship through [facility] and Families [address]                                    [place], [place] [phone] Therapy/tele-psych/case  [facility] [address] [place], [place] [phone]    Dr. Lolly Mustache  [phone]   [facility]  [employer] [facility] Dept. 1) [address] [place] 2) [address], [place] 3)  [address] [place]  [address], [place] [phone]  [phone]   [facility] Child Abuse Hotline [phone] or [phone] (After Hours)

## 2014-05-30 NOTE — ED Provider Notes (Signed)
CSN: [id number]     Arrival date & time 05/30/14  2134 History  This chart was scribed for Greg EmeryNicole Heaven Meeker, PA-C, working with Glynn OctaveStephen Rancour, MD by Chestine SporeSoijett Blue, ED Scribe. The patient was seen in room TR08C/TR08C at 10:16 PM.    Chief Complaint  Patient presents with  . Rash  . Sore Throat     The history is provided by the patient. No language interpreter was used.    HPI Comments: Greg Maldonado is a 34 y.o. male who presents to the Emergency Department complaining of rash onset 3 days. He reports that he has a rash on his arms, face, pets, and torso. He denies any new detergents, lotions, soaps. 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 He reports that after he ate a restaurant he began to feel itchy. He notes that the hives were not on his face and now the rash is. He states that he is having associated symptoms of sore throat. He reports that the sore throat began today. He states that he has tried benadryl and cream with no relief for his symptoms. He denies fever, chills and any other symptoms. He denies anyone in the home having the rash.   History reviewed. No pertinent past medical history. History reviewed. No pertinent past surgical history. No family history on file. History  Substance Use Topics  . Smoking status: Current Every Day Smoker -- 3.00 packs/day  . Smokeless tobacco: Not on file  . Alcohol Use: 1.2 oz/week    2 Cans of beer per week    Review of Systems  A complete 10 system review of systems was obtained and all systems are negative except as noted in the HPI and PMH.    Allergies  Review of patient's allergies indicates no known allergies.  Home Medications   Prior to Admission medications   Medication Sig Start Date End Date Taking? Authorizing Provider  acetaminophen (TYLENOL) 500 MG tablet Take 1,000 mg by mouth every 6 (six) hours as needed for pain.     Historical Provider, MD  cephALEXin (KEFLEX)  500 MG capsule Take 1 capsule (500 mg total) by mouth 4 (four) times daily. Patient not taking: Reported on 05/01/2014 08/15/12   Arman FilterGail K Schulz, NP  HYDROcodone-acetaminophen (NORCO/VICODIN) 5-325 MG per tablet Take 1 tablet by mouth every 4 (four) hours as needed. 05/01/14   Garlon HatchetLisa M Sanders, PA-C  ibuprofen (ADVIL,MOTRIN) 200 MG tablet Take 4 tablets (800 mg total) by mouth once. Patient taking differently: Take 800 mg by mouth every 6 (six) hours as needed for moderate pain.  10/02/13   Fayrene HelperBowie Tran, PA-C  ibuprofen (ADVIL,MOTRIN) 800 MG tablet Take 1 tablet (800 mg total) by mouth 3 (three) times daily as needed for mild pain or moderate pain. Patient not taking: Reported on 05/01/2014 03/05/14   Dahlia ClientHannah Muthersbaugh, PA-C  methocarbamol (ROBAXIN) 500 MG tablet Take 1 tablet (500 mg total) by mouth 2 (two) times daily. Patient not taking: Reported on 05/01/2014 10/02/13   Fayrene HelperBowie Tran, PA-C  naproxen (NAPROSYN) 500 MG tablet Take 1 tablet (500 mg total) by mouth 2 (two) times daily with a meal. 05/01/14   Garlon HatchetLisa M Sanders, PA-C  oxyCODONE-acetaminophen (PERCOCET) 10-325 MG per tablet Take 1 tablet by mouth every 4 (four) hours as needed for pain. Patient not taking: Reported on 05/01/2014 03/01/14   Joni ReiningNicole Janifer Gieselman, PA-C   BP 121/75 mmHg  Pulse 92  Temp(Src) 98.4 F (36.9  C) (Oral)  Resp 16  SpO2 98%  Physical Exam  Constitutional: He is oriented to person, place, and time. He appears well-developed and well-nourished. No distress.  HENT:  Head: Normocephalic and atraumatic.  Mouth/Throat: Oropharynx is clear and moist.  No drooling or stridor. Posterior pharynx mildly erythematous no significant tonsillar hypertrophy. No exudate. Soft palate rises symmetrically. No TTP or induration under tongue.   No tenderness to palpation of frontal or bilateral maxillary sinuses.  No mucosal edema in the nares.  Bilateral tympanic membranes with normal architecture and good light reflex.    Eyes: EOM are  normal. Pupils are equal, round, and reactive to light.  Neck: Normal range of motion. Neck supple. No tracheal deviation present.  Cardiovascular: Normal rate, regular rhythm and intact distal pulses.   Pulmonary/Chest: Effort normal and breath sounds normal. No respiratory distress. He has no wheezes. He has no rales. He exhibits no tenderness.  No stridor or drooling. No posterior pharynx edema, lip or tongue swelling. Pt reclining comfortably, speaking in complete sentences.   No Wheezing, excellent air movement in all fields.     Abdominal: Soft. He exhibits no mass. There is no tenderness. There is no rebound and no guarding.  Musculoskeletal: Normal range of motion.  Neurological: He is alert and oriented to person, place, and time.  Skin: Skin is warm and dry. Rash noted.  Hives to face, anterior and posterior torso and all 4 extremities. No warmth, TTP or discharge. Lesions are blanchable; sparing palms, soles and mucous membranes.   Psychiatric: He has a normal mood and affect. His behavior is normal.  Nursing note and vitals reviewed.   ED Course  Procedures (including critical care time) DIAGNOSTIC STUDIES: Oxygen Saturation is 98% on room air, normal by my interpretation.    COORDINATION OF CARE: 10:20 PM-Discussed treatment plan which includes Allergic Reaction cocktail, Pepcid, I-stat chem 8,  with pt at bedside and pt agreed to plan.   Labs Review Labs Reviewed  I-STAT CHEM 8, ED - Abnormal; Notable for the following:    Glucose, Bld 106 (*)    All other components within normal limits  RAPID STREP SCREEN    Imaging Review No results found.   EKG Interpretation None      MDM   Final diagnoses:  Hives  Acute pharyngitis, unspecified pharyngitis type    Filed Vitals:   05/30/14 2142 05/31/14 0008  BP: 121/75 123/68  Pulse: 92 83  Temp: 98.4 F (36.9 C) 98 F (36.7 C)  TempSrc: Oral Oral  Resp: 16 16  SpO2: 98% 98%    Medications  sodium  chloride 0.9 % bolus 1,000 mL (1,000 mLs Intravenous New Bag/Given 05/30/14 2246)  famotidine (PEPCID) IVPB 20 mg (20 mg Intravenous New Bag/Given 05/30/14 2246)  methylPREDNISolone sodium succinate (SOLU-MEDROL) 125 mg/2 mL injection 125 mg (125 mg Intravenous Given 05/30/14 2246)  diphenhydrAMINE (BENADRYL) injection 25 mg (25 mg Intravenous Given 05/30/14 2246)    Samvel Chmiel is a pleasant 34 y.o. male presenting with extensive hives onset 5 days ago, no known new exposure. Patient also has sore throat onset this morning. Based on the timing of his symptoms, I doubt the two are related, no indication for epinephrine. No significant tonsillar hypertrophy, signs of PTA or retropharyngeal abscess. Rapid strep is negative. Patient is given IV Solu-Medrol, Benadryl, fluids and Pepcid with excellent relief of hives.  Evaluation does not show pathology that would require ongoing emergent intervention or inpatient treatment. Pt is hemodynamically  stable and mentating appropriately. Discussed findings and plan with patient/guardian, who agrees with care plan. All questions answered. Return precautions discussed and outpatient follow up given.   New Prescriptions   FAMOTIDINE (PEPCID) 20 MG TABLET    Take 1 tablet (20 mg total) by mouth 2 (two) times daily.   PREDNISONE (DELTASONE) 50 MG TABLET    Take 1 tablet daily with breakfast     I personally performed the services described in this documentation, which was scribed in my presence. The recorded information has been reviewed and is accurate.    Greg Emery, PA-C 05/31/14 1610  Glynn Octave, MD 05/31/14 [phone]

## 2014-05-30 NOTE — ED Notes (Signed)
Pt. reports itchy rashes/hives at arms , face and torso onset Sunday and sore throat today , respirations unlabored / airway intact , no fever or chills.

## 2014-06-02 LAB — CULTURE, GROUP A STREP: Strep A Culture: NEGATIVE

## 2014-07-16 ENCOUNTER — Emergency Department (HOSPITAL_COMMUNITY)
Admission: EM | Admit: 2014-07-16 | Discharge: 2014-07-16 | Disposition: A | Discharge status: Home / Self Care | Payer: Self-pay | Attending: Emergency Medicine | Admitting: Emergency Medicine

## 2014-07-16 ENCOUNTER — Encounter (HOSPITAL_COMMUNITY): Payer: Self-pay | Admitting: *Deleted

## 2014-07-16 DIAGNOSIS — Y9389 Activity, other specified: Secondary | ICD-10-CM | POA: Insufficient documentation

## 2014-07-16 DIAGNOSIS — S39012A Strain of muscle, fascia and tendon of lower back, initial encounter: Secondary | ICD-10-CM | POA: Insufficient documentation

## 2014-07-16 DIAGNOSIS — Z720 Tobacco use: Secondary | ICD-10-CM | POA: Insufficient documentation

## 2014-07-16 DIAGNOSIS — T148XXA Other injury of unspecified body region, initial encounter: Secondary | ICD-10-CM

## 2014-07-16 DIAGNOSIS — Y9289 Other specified places as the place of occurrence of the external cause: Secondary | ICD-10-CM | POA: Insufficient documentation

## 2014-07-16 DIAGNOSIS — X58XXXA Exposure to other specified factors, initial encounter: Secondary | ICD-10-CM | POA: Insufficient documentation

## 2014-07-16 DIAGNOSIS — Y990 Civilian activity done for income or pay: Secondary | ICD-10-CM | POA: Insufficient documentation

## 2014-07-16 MED ORDER — CYCLOBENZAPRINE HCL 10 MG PO TABS: 10.0000 mg | ORAL_TABLET | Freq: Two times a day (BID) | ORAL | Status: DC | PRN

## 2014-07-16 NOTE — Discharge Instructions (Signed)
Distensin muscular. (Muscle Strain) Una distensin muscular es una lesin que se produce cuando un msculo se estira ms all de su largo normal. Cuando esto sucede, por lo general se desgarra un pequeo nmero de fibras musculares. La distensin muscular se califica en grados. Las distensiones de primer grado son aquellas en las cuales el desgarro y el dolor afectan a la menor cantidad de fibras musculares. Las distensiones de segundo y tercer grado involucran una proporcin cada vez mayor de desgarro y dolor.  En general, la recuperacin de una distensin muscular tarda de 1 a 2semanas. La curacin completa tarda de 5 a 6semanas.  CAUSAS  Las distensiones musculares ocurren cuando se aplica una fuerza violenta y repentina sobre un msculo y este se estira demasiado. Esto puede ocurrir cuando se levantan objetos, se practican deportes o en una cada.  FACTORES DE RIESGO La distensin muscular es especialmente comn en los atletas.  SIGNOS Y SNTOMAS En el lugar de la distensin muscular se puede presentar lo siguiente:  Dolor.  Moretones.  Hinchazn.  Dificultad para usar el msculo debido al dolor o a un funcionamiento anormal. DIAGNSTICO  El mdico le har un examen fsico y le har preguntas sobre sus antecedentes mdicos. TRATAMIENTO  Con frecuencia, el mejor tratamiento para una distensin muscular es el reposo, y la aplicacin de hielo y de compresas fras en la zona de la lesin.  INSTRUCCIONES PARA EL CUIDADO EN EL HOGAR   Use el mtodo PRICE (por sus siglas en ingls) de tratamiento para estimular la curacin durante los primeros 2 a 3das posteriores a la lesin. El mtodo PRICE implica lo siguiente:  Proteger al msculo de nuevas lesiones.  Limitar la actividad y descansar la parte del cuerpo lesionada.  Aplicar hielo a la lesin. Para hacerlo, ponga hielo en una bolsa plstica. Coloque una toalla entre la piel y la bolsa de hielo. Luego aplique el hielo y djelo actuar  de 15 a 20minutos por hora. Despus del tercer da, cambie a compresas de calor hmedo.  Comprimir la zona lesionada con una frula o venda elstica. Tenga cuidado de no ajustarla demasiado. Esto puede interferir con la circulacin sangunea o aumentar la hinchazn.  Mantener la zona lesionada por encima del nivel del corazn con la mayor frecuencia posible.  Utilice los medicamentos de venta libre o recetados para calmar el dolor, el malestar o la fiebre, segn se lo indique el mdico.  Realizar un calentamiento antes de hacer ejercicio ayuda a prevenir distensiones musculares futuras. SOLICITE ATENCIN MDICA SI:   Siente un dolor cada vez ms intenso o hinchazn en la zona lesionada.  Siente adormecimiento, hormigueo o nota una prdida importante de fuerza en la zona lesionada. ASEGRESE DE QUE:   Comprende estas instrucciones.  Controlar su afeccin.  Recibir ayuda de inmediato si no mejora o si empeora. Document Released: 01/07/2005 Document Revised: 01/18/2013 ExitCare Patient Information 2015 [employer]. This information is not intended to replace advice given to you by your health care provider. Make sure you discuss any questions you have with your health care provider.  

## 2014-07-16 NOTE — ED Provider Notes (Signed)
CSN: [id number]     Arrival date & time 07/16/14  1457 History   First MD Initiated Contact with Patient 07/16/14 1517    This chart was scribed for non-physician practitioner, Teressa Lower, NP working with Linwood Dibbles, MD by Marica Otter, ED Scribe. This patient was seen in room TR08C/TR08C and the patient's care was started at 3:18 PM.  Chief Complaint  Patient presents with  . Back Pain   The history is provided by the patient. No language interpreter was used.   PCP: No PCP Per Patient HPI Comments: Greg Maldonado is a 34 y.o. male, with no pertinent PNH and hx of daily tobacco use (3ppd), who presents to the Emergency Department complaining of traumatic right, lower back pain onset Saturday night while at work after pt lifted some heavy materials. Pt states that the pain is made worse with movement and rates his current pain a 10 out of 10. Pt reports taking OTC meds to alleviate his pain without relief. Pt denies numbness, trouble urinating.    History reviewed. No pertinent past medical history. History reviewed. No pertinent past surgical history. No family history on file. History  Substance Use Topics  . Smoking status: Current Every Day Smoker -- 3.00 packs/day  . Smokeless tobacco: Not on file  . Alcohol Use: 1.2 oz/week    2 Cans of beer per week    Review of Systems  Constitutional: Negative for fever and chills.  Genitourinary: Negative for difficulty urinating.  Musculoskeletal: Positive for back pain.  Neurological: Negative for numbness.  Psychiatric/Behavioral: Negative for confusion.  All other systems reviewed and are negative.  Allergies  Review of patient's allergies indicates no known allergies.  Home Medications   Prior to Admission medications   Medication Sig Start Date End Date Taking? Authorizing Provider  famotidine (PEPCID) 20 MG tablet Take 1 tablet (20 mg total) by mouth 2 (two) times daily. Patient not taking: Reported on 07/16/2014 05/30/14    Joni Reining Pisciotta, PA-C  predniSONE (DELTASONE) 50 MG tablet Take 1 tablet daily with breakfast Patient not taking: Reported on 07/16/2014 05/30/14   Joni Reining Pisciotta, PA-C   Triage Vitals: BP 113/72 mmHg  Pulse 85  Temp(Src) 98.4 F (36.9 C) (Oral)  Resp 16  SpO2 97% Physical Exam  Constitutional: He is oriented to person, place, and time. He appears well-developed and well-nourished. No distress.  HENT:  Head: Normocephalic and atraumatic.  Eyes: Conjunctivae and EOM are normal.  Neck: Neck supple.  Cardiovascular: Normal rate.   Pulmonary/Chest: Effort normal. No respiratory distress.  Musculoskeletal: Normal range of motion.  Right lumbar paraspinal tenderness. Full rom of all extremities. Good strength and sensation  Neurological: He is alert and oriented to person, place, and time.  Skin: Skin is warm and dry.  Psychiatric: He has a normal mood and affect. His behavior is normal.  Nursing note and vitals reviewed.   ED Course  Procedures (including critical care time) DIAGNOSTIC STUDIES: Oxygen Saturation is 97% on RA, nl by my interpretation.    COORDINATION OF CARE: 3:20 PM-Discussed treatment plan which includes muscle relaxers with pt at bedside and pt agreed to plan.   Labs Review Labs Reviewed - No data to display  Imaging Review No results found.   EKG Interpretation None      MDM   Final diagnoses:  Muscle strain    Pt given flexeril for symptoms. Pt is neurologically intact  I personally performed the services described in this documentation, which was scribed  in my presence. The recorded information has been reviewed and is accurate.     Teressa LowerVrinda Asianna Brundage, NP 07/16/14 1620  Linwood DibblesJon Knapp, MD 07/17/14 [phone]

## 2014-07-16 NOTE — ED Notes (Signed)
Pt stable, ambulatory, states understanding of discharge instructions 

## 2014-07-16 NOTE — ED Notes (Signed)
Pt is here with right lower back pain and states started Saturday nite.  Pt states with every movement of the lift, it was hurting his back.

## 2014-07-16 NOTE — ED Notes (Signed)
C/O right lower back pain x 3 days. States injured it at work.

## 2014-10-24 ENCOUNTER — Encounter (HOSPITAL_COMMUNITY): Payer: Self-pay | Admitting: Emergency Medicine

## 2014-10-24 ENCOUNTER — Emergency Department (HOSPITAL_COMMUNITY)
Admission: EM | Admit: 2014-10-24 | Discharge: 2014-10-24 | Disposition: A | Discharge status: Home / Self Care | Payer: Self-pay | Attending: Emergency Medicine | Admitting: Emergency Medicine

## 2014-10-24 DIAGNOSIS — S86812A Strain of other muscle(s) and tendon(s) at lower leg level, left leg, initial encounter: Secondary | ICD-10-CM | POA: Insufficient documentation

## 2014-10-24 DIAGNOSIS — Y998 Other external cause status: Secondary | ICD-10-CM | POA: Insufficient documentation

## 2014-10-24 DIAGNOSIS — X58XXXA Exposure to other specified factors, initial encounter: Secondary | ICD-10-CM | POA: Insufficient documentation

## 2014-10-24 DIAGNOSIS — Z720 Tobacco use: Secondary | ICD-10-CM | POA: Insufficient documentation

## 2014-10-24 DIAGNOSIS — L0291 Cutaneous abscess, unspecified: Secondary | ICD-10-CM

## 2014-10-24 DIAGNOSIS — Y9389 Activity, other specified: Secondary | ICD-10-CM | POA: Insufficient documentation

## 2014-10-24 DIAGNOSIS — M65051 Abscess of tendon sheath, right thigh: Secondary | ICD-10-CM | POA: Insufficient documentation

## 2014-10-24 DIAGNOSIS — S8392XA Sprain of unspecified site of left knee, initial encounter: Secondary | ICD-10-CM | POA: Insufficient documentation

## 2014-10-24 DIAGNOSIS — Y9289 Other specified places as the place of occurrence of the external cause: Secondary | ICD-10-CM | POA: Insufficient documentation

## 2014-10-24 MED ORDER — TRAMADOL HCL 50 MG PO TABS: 50.0000 mg | ORAL_TABLET | Freq: Four times a day (QID) | ORAL | Status: DC | PRN

## 2014-10-24 MED ORDER — LIDOCAINE HCL (PF) 2 % IJ SOLN
10.0000 mL | Freq: Once | INTRAMUSCULAR | Status: AC
  Administered 2014-10-24: 10 mL via INTRADERMAL
  Filled 2014-10-24: qty 10

## 2014-10-24 MED ORDER — SULFAMETHOXAZOLE-TRIMETHOPRIM 800-160 MG PO TABS: 1.0000 | ORAL_TABLET | Freq: Two times a day (BID) | ORAL | Status: AC

## 2014-10-24 NOTE — ED Provider Notes (Signed)
CSN: [id number]     Arrival date & time 10/24/14  2027 History   First MD Initiated Contact with Patient 10/24/14 2035     Chief Complaint  Patient presents with  . Abscess     (Consider location/radiation/quality/duration/timing/severity/associated sxs/prior Treatment) HPI  Greg Maldonado is a 34 y.o. male who presents to the Emergency Department complaining of boil to his right thigh for 2-3 days.  Reports increasing pain and redness.  significant other tried to squeeze it yesterday and now has increasing pain.  He reports h/x frequent boils.  He also c/o left knee pain for two weeks.  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  Reports pain to the knee is worse with bending.  Resolves at rest.  He denies fever, chills, swelling of the joint, redness or numbness.  He has not tried any medication for his symptoms   History reviewed. No pertinent past medical history. History reviewed. No pertinent past surgical history. History reviewed. No pertinent family history. History  Substance Use Topics  . Smoking status: Current Every Day Smoker -- 3.00 packs/day  . Smokeless tobacco: Not on file  . Alcohol Use: 1.2 oz/week    2 Cans of beer per week    Review of Systems  Constitutional: Negative for fever and chills.  Gastrointestinal: Negative for nausea and vomiting.  Musculoskeletal: Negative for joint swelling and arthralgias (Left knee pain).  Skin: Positive for color change.       Abscess   Hematological: Negative for adenopathy.  All other systems reviewed and are negative.     Allergies  Review of patient's allergies indicates no known allergies.  Home Medications   Prior to Admission medications   Medication Sig Start Date End Date Taking? Authorizing Provider  cyclobenzaprine (FLEXERIL) 10 MG tablet Take 1 tablet (10 mg total) by mouth 2 (two) times daily as needed for muscle spasms. Patient not taking: Reported on 10/24/2014 07/16/14   Teressa Lower, NP  famotidine (PEPCID) 20 MG tablet Take 1 tablet (20 mg total) by mouth 2 (two) times daily. Patient not taking: Reported on 07/16/2014 05/30/14   Joni Reining Pisciotta, PA-C  predniSONE (DELTASONE) 50 MG tablet Take 1 tablet daily with breakfast Patient not taking: Reported on 07/16/2014 05/30/14   Joni Reining Pisciotta, PA-C   BP 122/68 mmHg  Pulse 90  Temp(Src) 98.9 F (37.2 C) (Oral)  Resp 20  Ht 5' 7.5" (1.715 m)  Wt 181 lb 8 oz (82.328 kg)  BMI 27.99 kg/m2  SpO2 98% Physical Exam  Constitutional: He is oriented to person, place, and time. He appears well-developed and well-nourished. No distress.  HENT:  Head: Normocephalic and atraumatic.  Cardiovascular: Normal rate, regular rhythm and normal heart sounds.   No murmur heard. Pulmonary/Chest: Effort normal and breath sounds normal. No respiratory distress.  Musculoskeletal: He exhibits tenderness. He exhibits no edema.  ttp of the posterior left popliteal fossa of the knee. No erythema, effusion, or edema. Patient has full range of motion of the knee. No calf pain or swelling.  Neurological: He is alert and oriented to person, place, and time. He exhibits normal muscle tone. Coordination normal.  Skin: Skin is warm and dry. There is erythema.  Localized area of erythema and induration to the lateral right thigh.  No fluctuance or drainage.    Nursing note and vitals reviewed.   ED Course  Procedures (including critical care time) Labs Review Labs Reviewed - No data to display  Imaging Review No results found.   EKG Interpretation None       INCISION AND DRAINAGE Performed by: Maxwell CaulRIPLETT,TAMMY L. Consent: Verbal consent obtained. Risks and benefits: risks, benefits and alternatives were discussed Type: abscess  Body area:right thigh  Anesthesia: local infiltration  Incision was made with a #11 scalpel.  Local anesthetic: lidocaine 2 % w/o epinephrine  Anesthetic total: 3 ml  Complexity: complex Blunt  dissection to break up loculations  Drainage: purulent  Drainage amount: small  Packing material: 1/4 in iodoform gauze  Patient tolerance: Patient tolerated the procedure well with no immediate complications.    MDM   Final diagnoses:  Knee sprain and strain, left, initial encounter  Abscess    Pt is well appearing.  Recurrent abscess to the right thigh.  Likely MRSA.  Agrees to warm soaks, packing removal in 2 days.  rx for ultram and bactrim   ACE wrap applied to knee.  Likely strain.  No concerning sx's for septic joint.  Wt bearing w/o difficulty.      Pauline Ausammy Triplett, PA-C 10/24/14 2226   Medical screening examination/treatment/procedure(s) were performed by non-physician practitioner and as supervising physician I was immediately available for consultation/collaboration.   EKG Interpretation None       Donnetta HutchingBrian Trigger Frasier, MD 11/01/14 [phone]

## 2014-10-24 NOTE — ED Notes (Addendum)
Pt c/o abscess to the rt hip for a couple of days. Pt also c/o left knee pain but denies injury

## 2014-10-24 NOTE — Discharge Instructions (Signed)
Absceso (Abscess)  Un absceso (granoo fornculo) es una zona infectada sobre la piel o debajo de la misma. Esta zona se llena de un lquido blanco amarillento (pus) y Conservator, museum/galleryotros materiales (residuos).  CUIDADOS EN EL HOGAR  Tome slo la medicacin que le indic el mdico.  Si le han recetado antibiticos, tmelos segn las indicaciones. Finalice el Selzmedicamento, aunque comience a Actorsentirse mejor.  Si le aplicaron una gasa, siga las indicaciones del mdico para Nigeriacambiarla.  Para evitar la propagacin de la infeccin:  Mantenga el absceso cubierto con el vendaje.  Lvese bien las manos.  No comparta artculos de cuidado personal, toallas o jacuzzis con otros.  Evite el contacto con la piel de Economistotras personas.  Mantenga la piel y la ropa limpia alrededor del absceso.  Cumpla con los controles mdicos segn las indicaciones. SOLICITE AYUDA DE INMEDIATO SI:  Aumenta el dolor, el enrojecimiento o la Paramedichinchazn en el lugar de la herida.  Observa lquido o sangre que proviene del sitio de la herida.  Tiene fiebre, escalofros o se siente enfermo.  Tiene fiebre. ASEGRESE DE QUE:   Comprende esas instrucciones para el alta mdica.  Controlar su enfermedad.  Solicitar ayuda de inmediato si no mejora o empeora. Document Released: 06/26/2008 Document Revised: 09/29/2011 [facility] Patient Information 2015 [employer]Care, [place]. This information is not intended to replace advice given to you by your health care provider. Make sure you discuss any questions you have with your health care provider.

## 2014-11-01 NOTE — ED Provider Notes (Signed)
Medical screening examination/treatment/procedure(s) were performed by non-physician practitioner and as supervising physician I was immediately available for consultation/collaboration.   EKG Interpretation None       Donnetta Hutching, MD 11/01/14 [phone]

## 2015-09-17 ENCOUNTER — Emergency Department (HOSPITAL_COMMUNITY)
Admission: EM | Admit: 2015-09-17 | Discharge: 2015-09-17 | Disposition: A | Discharge status: Home / Self Care | Payer: Self-pay | Attending: Emergency Medicine | Admitting: Emergency Medicine

## 2015-09-17 ENCOUNTER — Encounter (HOSPITAL_COMMUNITY): Payer: Self-pay | Admitting: Emergency Medicine

## 2015-09-17 DIAGNOSIS — Y9289 Other specified places as the place of occurrence of the external cause: Secondary | ICD-10-CM | POA: Insufficient documentation

## 2015-09-17 DIAGNOSIS — Y990 Civilian activity done for income or pay: Secondary | ICD-10-CM | POA: Insufficient documentation

## 2015-09-17 DIAGNOSIS — Y93F2 Activity, caregiving, lifting: Secondary | ICD-10-CM | POA: Insufficient documentation

## 2015-09-17 DIAGNOSIS — X500XXA Overexertion from strenuous movement or load, initial encounter: Secondary | ICD-10-CM | POA: Insufficient documentation

## 2015-09-17 DIAGNOSIS — Z791 Long term (current) use of non-steroidal anti-inflammatories (NSAID): Secondary | ICD-10-CM | POA: Insufficient documentation

## 2015-09-17 DIAGNOSIS — S39012A Strain of muscle, fascia and tendon of lower back, initial encounter: Secondary | ICD-10-CM | POA: Insufficient documentation

## 2015-09-17 DIAGNOSIS — F17210 Nicotine dependence, cigarettes, uncomplicated: Secondary | ICD-10-CM | POA: Insufficient documentation

## 2015-09-17 MED ORDER — IBUPROFEN 800 MG PO TABS: 800.0000 mg | ORAL_TABLET | Freq: Three times a day (TID) | ORAL | Status: DC

## 2015-09-17 MED ORDER — HYDROCODONE-ACETAMINOPHEN 5-325 MG PO TABS: 1.0000 | ORAL_TABLET | Freq: Four times a day (QID) | ORAL | Status: DC | PRN

## 2015-09-17 NOTE — ED Notes (Signed)
PT reports lower back pain x1 week with no injury. PT denies any urinary symptoms. PT ambulatory in ED with NAD noted.

## 2015-09-17 NOTE — ED Provider Notes (Signed)
CSN: [id number]     Arrival date & time 09/17/15  1029 History  By signing my name below, I, Iona Beard, attest that this documentation has been prepared under the direction and in the presence of Jacalyn Lefevre, MD.   Electronically Signed: Iona Beard, ED Scribe. 09/17/2015. 10:57 AM   Chief Complaint  Patient presents with  . Back Pain    The history is provided by the patient. No language interpreter was used.   HPI Comments: Greg Maldonado is a 35 y.o. male who presents to the Emergency Department complaining of gradual onset, lower right back pain, ongoing for one week. 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 No other associated symptoms noted. Pt states the pain is worse with movement, sitting, sneezing, and bending at the waist. No other worsening or alleviating factors noted. Pt denies radiating pain, bladder incontinence, bowel incontinence, numbness, tingling, weakness, or any other pertinent symptoms.  History reviewed. No pertinent past medical history. History reviewed. No pertinent past surgical history. History reviewed. No pertinent family history. Social History  Substance Use Topics  . Smoking status: Current Every Day Smoker -- 1.00 packs/day    Types: Cigarettes  . Smokeless tobacco: None  . Alcohol Use: 1.2 oz/week    2 Cans of beer per week    Review of Systems  Musculoskeletal: Positive for back pain.  Neurological: Negative for weakness and numbness.  All other systems reviewed and are negative.    Allergies  Review of patient's allergies indicates no known allergies.  Home Medications   Prior to Admission medications   Medication Sig Start Date End Date Taking? Authorizing Provider  HYDROcodone-acetaminophen (NORCO/VICODIN) 5-325 MG tablet Take 1 tablet by mouth every 6 (six) hours as needed for severe pain. 09/17/15   Jacalyn Lefevre, MD  ibuprofen (ADVIL,MOTRIN) 800 MG tablet Take 1 tablet (800 mg total)  by mouth 3 (three) times daily. 09/17/15   Jacalyn Lefevre, MD  traMADol (ULTRAM) 50 MG tablet Take 1 tablet (50 mg total) by mouth every 6 (six) hours as needed. 10/24/14   Tammy Triplett, PA-C   BP 122/75 mmHg  Pulse 70  Temp(Src) 98.5 F (36.9 C) (Oral)  Resp 18  Ht  (1.702 m)  Wt 180 lb (81.647 kg)  BMI 28.19 kg/m2  SpO2 98% Physical Exam  Constitutional: He is oriented to person, place, and time. He appears well-developed and well-nourished.  HENT:  Head: Normocephalic.  Eyes: EOM are normal.  Neck: Normal range of motion.  Pulmonary/Chest: Effort normal.  Abdominal: He exhibits no distension.  Musculoskeletal: Normal range of motion. He exhibits no tenderness.       Lumbar back: He exhibits no tenderness and no bony tenderness.  Neurological: He is alert and oriented to person, place, and time. He has normal strength. No cranial nerve deficit or sensory deficit. Coordination normal.  Psychiatric: He has a normal mood and affect.  Nursing note and vitals reviewed.   ED Course  Procedures (including critical care time) DIAGNOSTIC STUDIES: Oxygen Saturation is 98% on RA, normal by my interpretation.    COORDINATION OF CARE: 10:46 AM Discussed treatment plan with pt at bedside and pt agreed to plan.  Labs Review Labs Reviewed - No data to display  Imaging Review No results found. I have personally reviewed and evaluated these images and lab results as part of my medical decision-making.   EKG Interpretation None      MDM  Pain has  been going on for a long time, it is just a little worse than normal.  No neurological signs.  Pt to f/u with ortho and work on core strength.  Pt knows to return if worse. Final diagnoses:  Lumbar strain, initial encounter    I personally performed the services described in this documentation, which was scribed in my presence. The recorded information has been reviewed and is accurate.     Jacalyn LefevreJulie Doak Mah, MD 09/17/15 1058

## 2015-09-28 ENCOUNTER — Encounter (HOSPITAL_COMMUNITY): Payer: Self-pay

## 2015-09-28 ENCOUNTER — Emergency Department (HOSPITAL_COMMUNITY): Payer: Self-pay

## 2015-09-28 ENCOUNTER — Emergency Department (HOSPITAL_COMMUNITY)
Admission: EM | Admit: 2015-09-28 | Discharge: 2015-09-28 | Disposition: A | Discharge status: Home / Self Care | Payer: Self-pay | Attending: Emergency Medicine | Admitting: Emergency Medicine

## 2015-09-28 DIAGNOSIS — Z791 Long term (current) use of non-steroidal anti-inflammatories (NSAID): Secondary | ICD-10-CM | POA: Insufficient documentation

## 2015-09-28 DIAGNOSIS — M5431 Sciatica, right side: Secondary | ICD-10-CM

## 2015-09-28 DIAGNOSIS — M5441 Lumbago with sciatica, right side: Secondary | ICD-10-CM | POA: Insufficient documentation

## 2015-09-28 DIAGNOSIS — F17210 Nicotine dependence, cigarettes, uncomplicated: Secondary | ICD-10-CM | POA: Insufficient documentation

## 2015-09-28 MED ORDER — NAPROXEN 500 MG PO TABS: 500.0000 mg | ORAL_TABLET | Freq: Two times a day (BID) | ORAL | Status: DC

## 2015-09-28 MED ORDER — KETOROLAC TROMETHAMINE 60 MG/2ML IM SOLN
30.0000 mg | Freq: Once | INTRAMUSCULAR | Status: AC
  Administered 2015-09-28: 30 mg via INTRAMUSCULAR
  Filled 2015-09-28: qty 2

## 2015-09-28 MED ORDER — CYCLOBENZAPRINE HCL 10 MG PO TABS: 10.0000 mg | ORAL_TABLET | Freq: Two times a day (BID) | ORAL | Status: DC | PRN

## 2015-09-28 MED ORDER — HYDROCODONE-ACETAMINOPHEN 5-325 MG PO TABS: 1.0000 | ORAL_TABLET | ORAL | Status: DC | PRN

## 2015-09-28 NOTE — Discharge Instructions (Signed)
Ciática  °(Sciatica) ° La ciática es el dolor, debilidad, entumecimiento u hormigueo a lo largo del nervio ciático. El nervio comienza en la zona inferior de la espalda y desciende por la parte posterior de cada pierna. El nervio controla los músculos de la parte inferior de la pierna y de la zona posterior de la rodilla, y transmite la sensibilidad a la parte posterior del muslo, la pierna y la planta del pie. La ciática es un síntoma de otras afecciones médicas. Por ejemplo, un daño a los nervios o algunas enfermedades como un disco herniado o un espolón óseo en la columna vertebral, podrían dañarle o presionar en el nervio ciático. Esto causa dolor, debilidad y otras sensaciones normalmente asociadas con la ciática. Generalmente la ciática afecta sólo un lado del cuerpo. °CAUSAS  °· Disco herniado o desplazado. °· Enfermedad degenerativa del disco. °· Un síndrome doloroso que compromete un músculo angosto de los glúteos (síndrome piriforme). °· Lesión o fractura pélvica. °· Embarazo. °· Tumor (casos raros). °SÍNTOMAS  °Los síntomas pueden variar de leves a muy graves. Por lo general, los síntomas descienden desde la zona lumbar a las nalgas y la parte posterior de la pierna. Ellos son:  °· Hormigueo leve o dolor sordo en la parte inferior de la espalda, la pierna o la cadera. °· Adormecimiento en la parte posterior de la pantorrilla o la planta del pie. °· Sensación de quemazón en la zona lumbar, la pierna o la cadera. °· Dolor agudo en la zona inferior de la espalda, la pierna o la cadera. °· Debilidad en las piernas. °· Dolor de espalda intenso que inhibe los movimientos. °Los síntomas pueden empeorar al toser, estornudar, reír o estar sentado o parado durante mucho tiempo. Además, el sobrepeso puede empeorar los síntomas.  °DIAGNÓSTICO  °Su médico le hará un examen físico para buscar los síntomas comunes de la ciática. Le pedirá que haga algunos movimientos o actividades que activarían el dolor del nervio  ciático. Para encontrar las causas de la ciática podrá indicarle otros estudios. Estos pueden ser:  °· Análisis de sangre. °· Radiografías. °· Pruebas de diagnóstico por imágenes, como resonancia magnética o tomografía computada. °TRATAMIENTO  °El tratamiento se dirige a las causas de la ciática. A veces, el tratamiento no es necesario, y el dolor y el malestar desaparecen por sí mismos. Si necesita tratamiento, su médico puede sugerir:  °· Medicamentos de venta libre para aliviar el dolor. °· Medicamentos recetados, como antiinflamatorios, relajantes musculares o narcóticos. °· Aplicación de calor o hielo en la zona del dolor. °· Inyecciones de corticoides para disminuir el dolor, la irritación y la inflamación alrededor del nervio. °· Reducción de la actividad en los períodos de dolor. °· Ejercicios y estiramiento del abdomen para fortalecer y mejorar la flexibilidad de la columna vertebral. Su médico puede sugerirle perder peso si el peso extra empeora el dolor de espalda. °· Fisioterapia. °· La cirugía para eliminar lo que presiona o pincha el nervio, como un espolón óseo o parte de una hernia de disco. °INSTRUCCIONES PARA EL CUIDADO EN EL HOGAR  °· Sólo tome medicamentos de venta libre o recetados para calmar el dolor o el malestar, según las indicaciones de su médico. °· Aplique hielo sobre el área dolorida durante 20 minutos 3-4 veces por día durante los primeras 48-72 horas. Luego intente aplicar calor de la misma manera. °· Haga ejercicios, elongue o realice sus actividades habituales, si no le causan más dolor. °· Cumpla con todas las sesiones de fisioterapia, según le   indique su médico. °· Cumpla con todas las visitas de control, según le indique su médico. °· No use tacones altos o zapatos que no tengan buen apoyo. °· Verifique que el colchón no sea muy blando. Un colchón firme aliviará el dolor y las molestias. °SOLICITE ATENCIÓN MÉDICA DE INMEDIATO SI:  °· Pierde el control de la vejiga o del intestino  (incontinencia). °· Aumenta la debilidad en la zona inferior de la espalda, la pelvis, las nalgas o las piernas. °· Siente irritación o inflamación en la espalda. °· Tiene sensación de ardor al orinar. °· El dolor empeora cuando se acuesta o lo despierta por la noche. °· El dolor es peor del que experimentó en el pasado. °· Dura más de 4 semanas. °· Pierde peso sin motivo de manera súbita. °ASEGÚRESE DE QUE:  °· Comprende estas instrucciones. °· Controlará su enfermedad. °· Solicitará ayuda de inmediato si no mejora o si empeora. °  °Esta información no tiene como fin reemplazar el consejo del médico. Asegúrese de hacerle al médico cualquier pregunta que tenga. °  °Document Released: 03/30/2005 Document Revised: 12/19/2014 °Elsevier Interactive Patient Education ©2016 [employer] ° °

## 2015-09-28 NOTE — ED Notes (Signed)
Pt reports pain in lower back for the past 2 months.  Pt says now pain radiates down r leg.

## 2015-09-28 NOTE — ED Provider Notes (Signed)
CSN: [id number]     Arrival date & time 09/28/15  1530 History   First MD Initiated Contact with Patient 09/28/15 1646     Chief Complaint  Patient presents with  . Back Pain     (Consider location/radiation/quality/duration/timing/severity/associated sxs/prior Treatment) Patient is a 35 y.o. male presenting with back pain. The history is provided by the patient. No language interpreter was used.  Back Pain Location:  Lumbar spine Radiates to:  R thigh Pain severity:  Severe Pain is:  Same all the time Onset quality:  Gradual Duration:  2 months Timing:  Constant Progression:  Worsening Chronicity:  New Context: physical stress   Relieved by:  Nothing Worsened by:  Movement and ambulation Ineffective treatments:  Ibuprofen Associated symptoms: no bladder incontinence and no bowel incontinence    Greg Maldonado is a 35 y.o. male who presents to the ED with lower back pain that started 2 months ago and has gotten worse with pain radiating down his right leg now. 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 He has not been able to work the past week due to pain.  Patient evaluated here 09/17/15 and treated for back pain.  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 At that time they did x-rays and treated him. He has had pain off and on since then but this is worse.  History reviewed. No pertinent past medical history. History reviewed. No pertinent past surgical history. No family history on file. Social History  Substance Use Topics  . Smoking status: Current Every Day Smoker -- 1.00 packs/day    Types: Cigarettes  . Smokeless tobacco: None  . Alcohol Use: 1.2 oz/week    2 Cans of beer per week     Comment: occ    Review of Systems  Gastrointestinal: Negative for bowel incontinence.  Genitourinary: Negative for bladder incontinence.  Musculoskeletal: Positive for back pain.   all other systems neagative    Allergies  Review of patient's allergies indicates no known allergies.  Home Medications   Prior to Admission medications   Medication Sig Start Date End Date Taking? Authorizing Provider  cyclobenzaprine (FLEXERIL) 10 MG tablet Take 1 tablet (10 mg total) by mouth 2 (two) times daily as needed for muscle spasms. 09/28/15   Hope Orlene Och, NP  HYDROcodone-acetaminophen (NORCO/VICODIN) 5-325 MG tablet Take 1 tablet by mouth every 4 (four) hours as needed. 09/28/15   Hope Orlene Och, NP  ibuprofen (ADVIL,MOTRIN) 800 MG tablet Take 1 tablet (800 mg total) by mouth 3 (three) times daily. 09/17/15   Jacalyn Lefevre, MD  naproxen (NAPROSYN) 500 MG tablet Take 1 tablet (500 mg total) by mouth 2 (two) times daily. 09/28/15   Hope Orlene Och, NP  traMADol (ULTRAM) 50 MG tablet Take 1 tablet (50 mg total) by mouth every 6 (six) hours as needed. 10/24/14   Tammy Triplett, PA-C   BP 110/78 mmHg  Pulse 88  Temp(Src) 98.3 F (36.8 C) (Oral)  Resp 16  Ht  (1.778 m)  Wt 81.647 kg  BMI 25.83 kg/m2  SpO2 97% Physical Exam  Constitutional: He is oriented to person, place, and time. He appears well-developed and well-nourished. No distress.  HENT:  Head: Normocephalic and atraumatic.  Eyes: EOM are normal.  Neck: Normal range of motion. Neck supple.  Cardiovascular: [id number] and regular rhythm.   Pulmonary/Chest: Effort normal  and breath sounds normal. No respiratory distress.  Abdominal: Soft. Bowel sounds are normal. There is no tenderness.  Musculoskeletal: Normal range of motion. He exhibits no edema.       Lumbar back: He exhibits tenderness and spasm. He exhibits no deformity and normal pulse. Decreased range of motion: due to pain.       Back:  Pain with straight leg raises on the right.  Neurological: He is alert and oriented to person, place, and time. He has normal strength. No cranial nerve deficit or sensory deficit. Coordination and gait normal.  Reflex  Scores:      Bicep reflexes are 2+ on the right side and 2+ on the left side.      Brachioradialis reflexes are 2+ on the right side and 2+ on the left side.      Patellar reflexes are 2+ on the right side and 2+ on the left side.      Achilles reflexes are 2+ on the right side and 2+ on the left side. Skin: Skin is warm and dry.  Psychiatric: He has a normal mood and affect. His behavior is normal.  Nursing note and vitals reviewed.   ED Course  Procedures (including critical care time) Xray, Toradol 60 mg IM Labs Review Labs Reviewed - No data to display  Imaging Review Dg Lumbar Spine Complete  09/28/2015  CLINICAL DATA:  Patient with history of trauma 14 years prior. Pain has worsened. EXAM: LUMBAR SPINE - COMPLETE 4+ VIEW COMPARISON:  None. FINDINGS: Normal anatomic alignment. No evidence for acute fracture dislocation. Preservation of the vertebral body and intervertebral disc space heights. IMPRESSION: No acute osseous abnormality. Electronically Signed   By: Annia Beltrew  Davis M.D.   On: 09/28/2015 17:41   I have personally reviewed and evaluated these images and lab results as part of my medical decision-making.   MDM  35 y.o. male with lower back pain x 2 months, last evaluated here 08/17/15 stable for d/c without acute findings on x-ray and no focal neuro deficits. Will treat for sciatica and patient encouraged to f/u with ortho. Discussed with the patient x-ray results and plan of care and all questioned fully answered. He will f/u with ortho or return here if any problems arise.   Final diagnoses:  Sciatica, right        [facility] Center[facility] M Neese, NP 09/28/15 1831  Mancel BaleElliott Wentz, MD 09/28/15 [phone]

## 2015-12-30 ENCOUNTER — Other Ambulatory Visit: Payer: Self-pay | Admitting: Orthopedic Surgery

## 2016-09-17 IMAGING — CR DG HAND COMPLETE 3+V*R*
3 series · 3 of 3 positions shown · non-contrast
Comparison: None.

CLINICAL DATA: RIGHT hand swelling which began after a fall from a
ladder. This occurred 02/23/2014. Trauma at that time. Initial
encounter.

EXAM:
RIGHT HAND - COMPLETE 3+ VIEW

[x hand pa right]
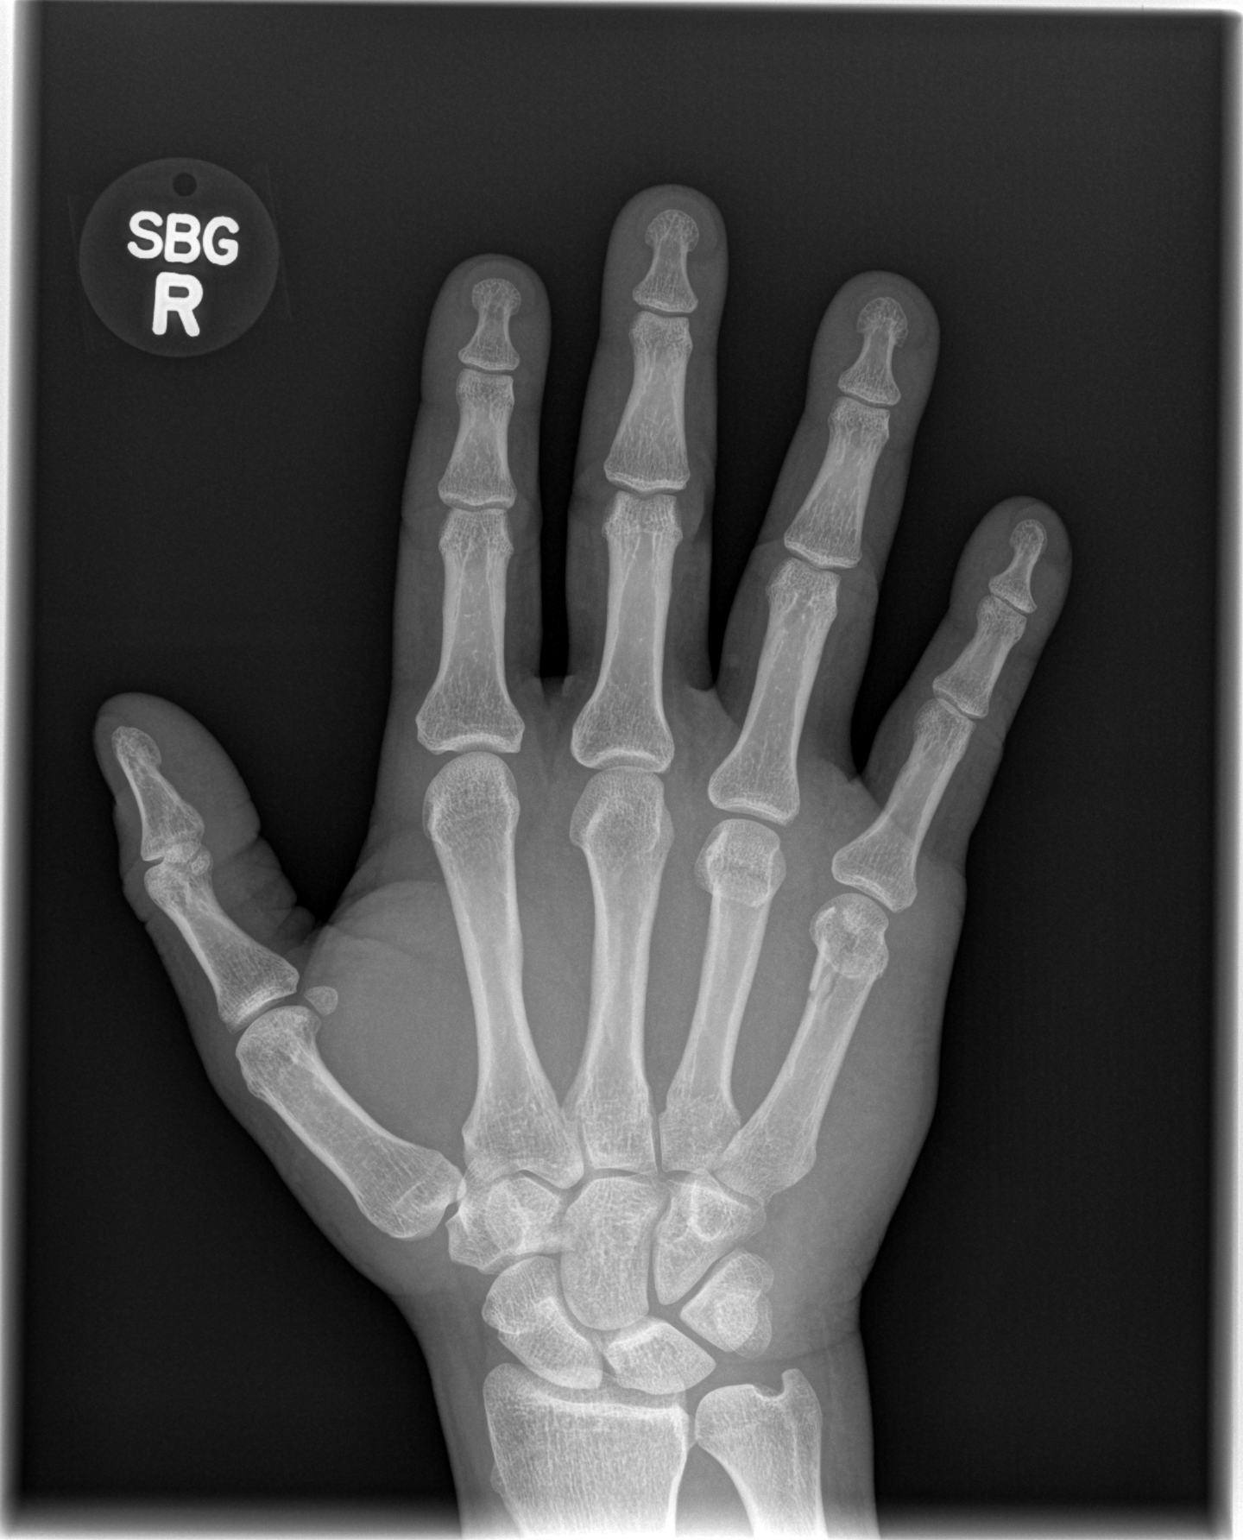

[x hand oblique right]
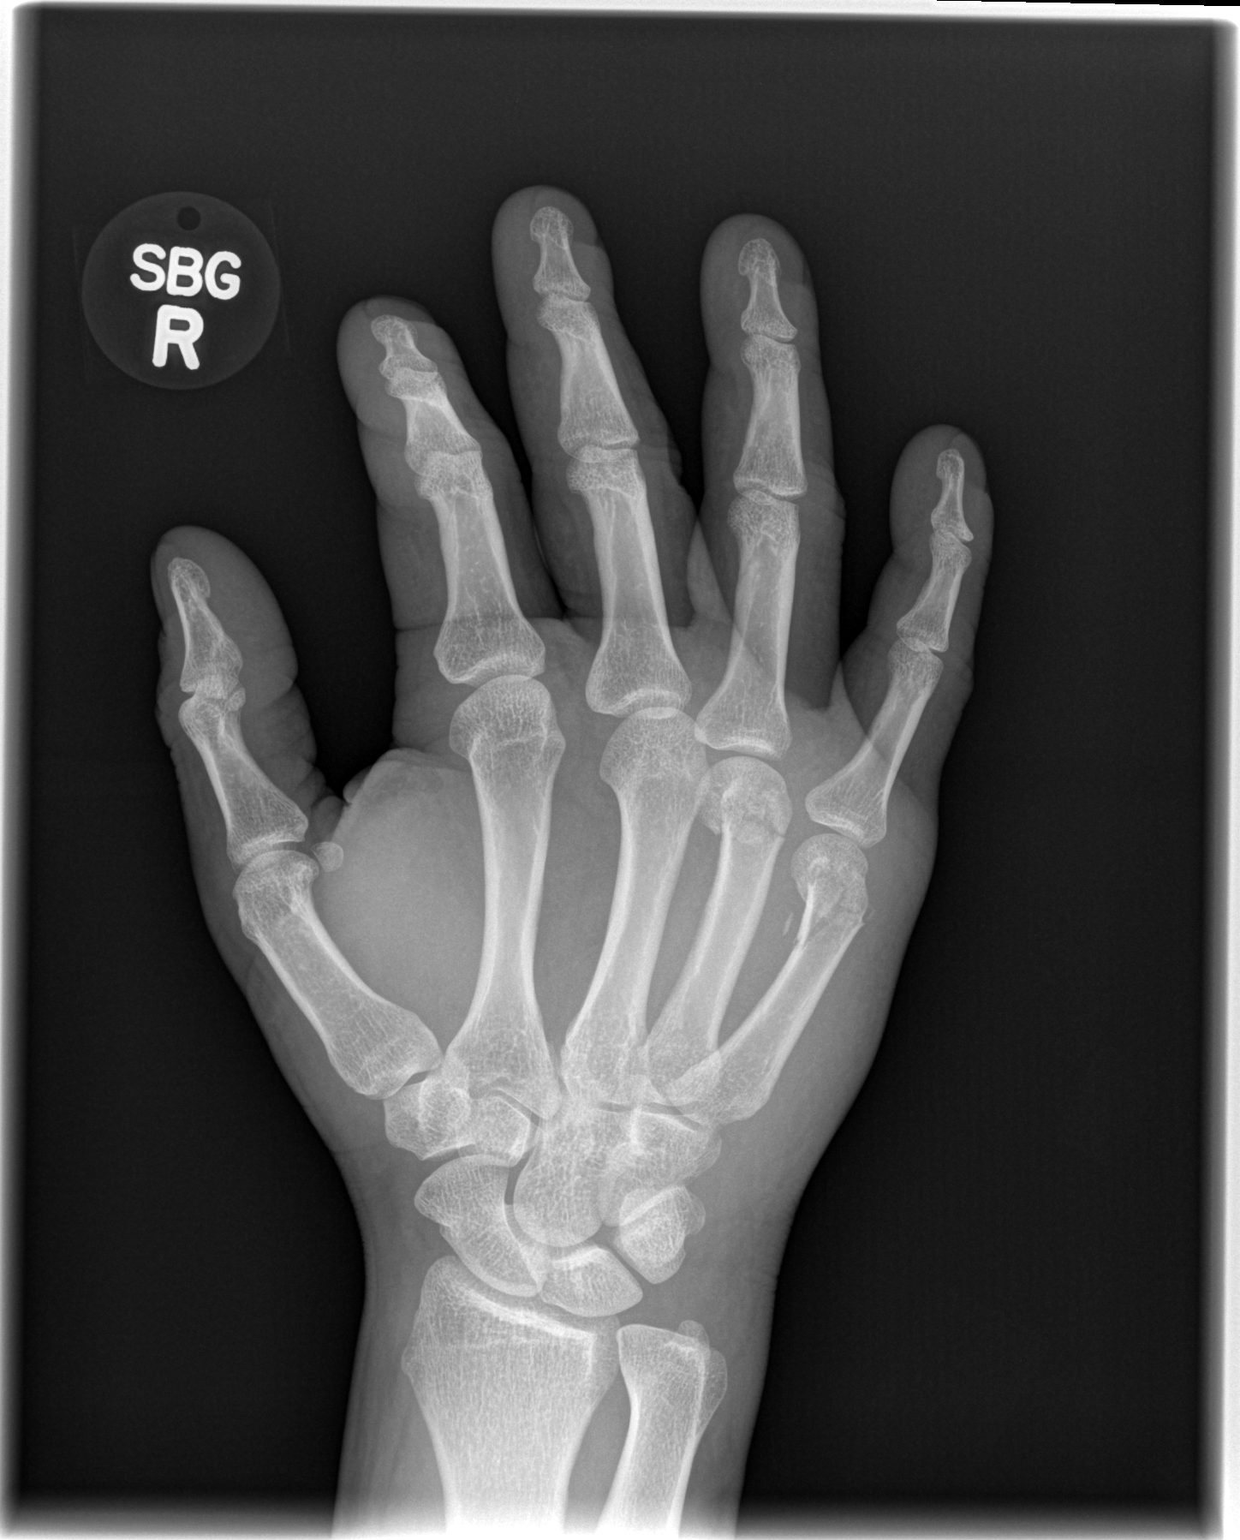

[x hand lat right]
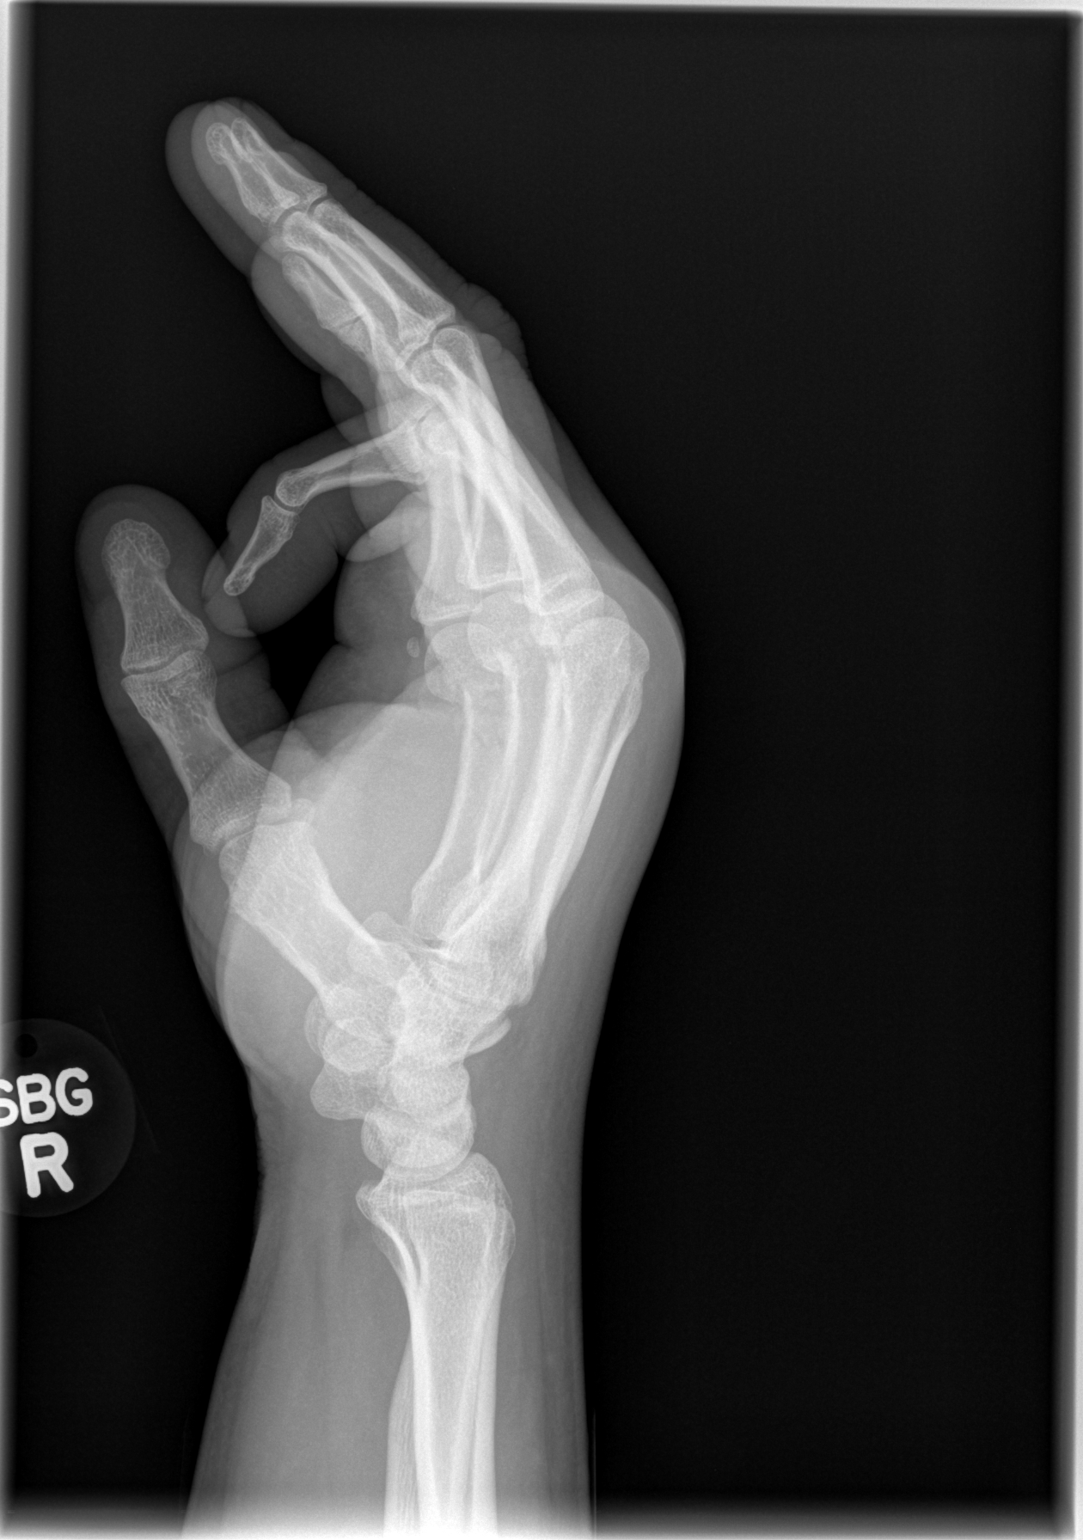

[3 of 3 positions shown; findings below may reference images not displayed]

FINDINGS: There is a comminuted fracture of the neck of the fifth metacarpal.
A second fracture involves the head of the fourth metacarpal. Slight
volar angulation of both metacarpal fractures. There is lateral soft
tissue swelling. No dislocation. Carpal bones grossly intact.
IMPRESSION: Fractures of the distal fourth and fifth metacarpals as described.
Soft tissue swelling. Comminution is observed of the fifth
metacarpal fracture.

## 2016-11-21 IMAGING — CR DG FINGER RING 2+V*L*
3 series · 3 of 3 positions shown · non-contrast
Comparison: None.

CLINICAL DATA: Dry wall fell on hand, crush injury.

EXAM:
LEFT RING FINGER 2+V

[finger ap]
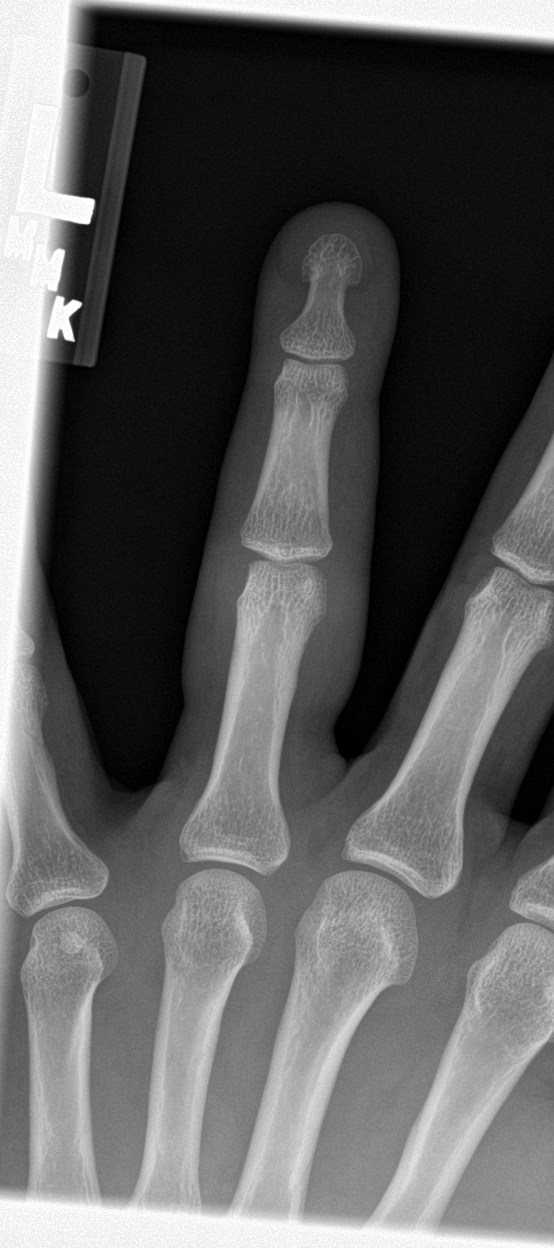

[finger obl]
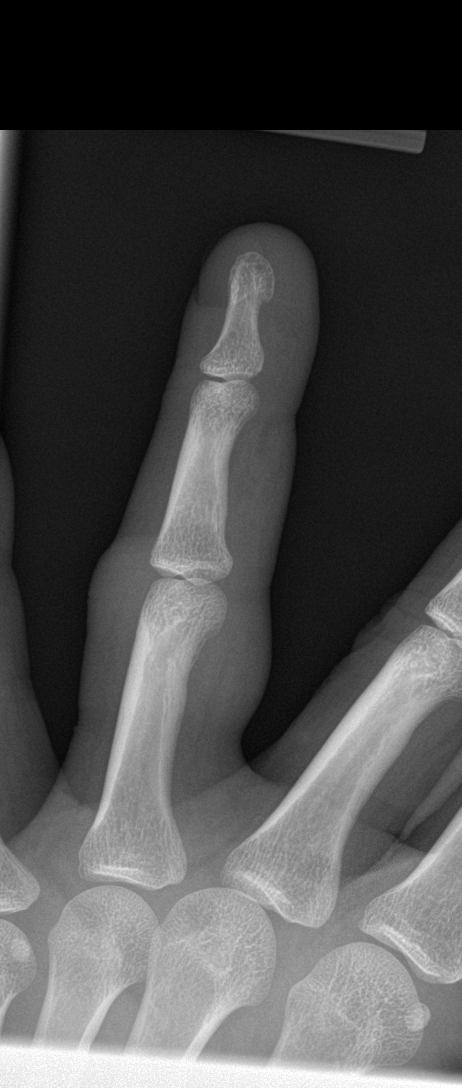

[finger lat]
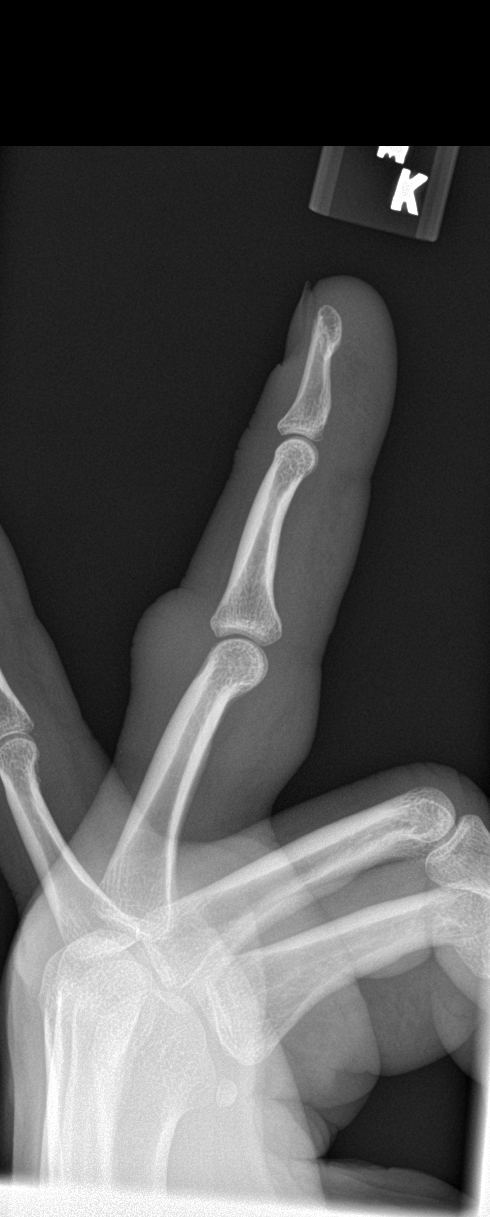

[3 of 3 positions shown; findings below may reference images not displayed]

FINDINGS: There is no evidence of fracture or dislocation. There is no
evidence of arthropathy or other focal bone abnormality. Focal
dorsal PIP soft tissue swelling without subcutaneous gas or
radiopaque foreign bodies.
IMPRESSION: Soft tissue swelling without fracture deformity or dislocation.

  By: Bukapos Badudu

## 2016-12-02 ENCOUNTER — Emergency Department (HOSPITAL_COMMUNITY)
Admission: EM | Admit: 2016-12-02 | Discharge: 2016-12-02 | Disposition: A | Discharge status: Home / Self Care | Payer: Self-pay | Attending: Emergency Medicine | Admitting: Emergency Medicine

## 2016-12-02 ENCOUNTER — Encounter (HOSPITAL_COMMUNITY): Payer: Self-pay | Admitting: Emergency Medicine

## 2016-12-02 DIAGNOSIS — L0291 Cutaneous abscess, unspecified: Secondary | ICD-10-CM

## 2016-12-02 DIAGNOSIS — L02811 Cutaneous abscess of head [any part, except face]: Secondary | ICD-10-CM | POA: Insufficient documentation

## 2016-12-02 DIAGNOSIS — F17210 Nicotine dependence, cigarettes, uncomplicated: Secondary | ICD-10-CM | POA: Insufficient documentation

## 2016-12-02 MED ORDER — SULFAMETHOXAZOLE-TRIMETHOPRIM 800-160 MG PO TABS: 2.0000 | ORAL_TABLET | Freq: Two times a day (BID) | ORAL | 0 refills | Status: DC

## 2016-12-02 MED ORDER — LIDOCAINE HCL (PF) 1 % IJ SOLN
5.0000 mL | Freq: Once | INTRAMUSCULAR | Status: AC
  Administered 2016-12-02: 5 mL via INTRADERMAL
  Filled 2016-12-02: qty 30

## 2016-12-02 MED ORDER — SULFAMETHOXAZOLE-TRIMETHOPRIM 800-160 MG PO TABS
1.0000 | ORAL_TABLET | Freq: Once | ORAL | Status: AC
  Administered 2016-12-02: 1 via ORAL
  Filled 2016-12-02: qty 1

## 2016-12-02 NOTE — ED Provider Notes (Signed)
[facility]-EMERGENCY DEPT Provider Note   CSN: [id number] Arrival date & time: 12/02/16  0551     History   Chief Complaint Chief Complaint  Patient presents with  . Neck Pain  . Abscess     HPI   Blood pressure 119/87, pulse 75, temperature 98 F (36.7 C), temperature source Oral, resp. rate 16, height 5\' 7"  (1.702 m), weight 84 kg (185 lb 3.2 oz), SpO2 100 %.  Greg Maldonado is a 36 y.o. male complaining of Painful swelling to posterior neck which she noticed 2 days ago after he was irritated by an ingrown hair. He's had multiple episodes of abscesses in the past. He denies any fevers, chills, nausea, vomiting.   History reviewed. No pertinent past medical history.  There are no active problems to display for this patient.   History reviewed. No pertinent surgical history.     Home Medications    Prior to Admission medications   Medication Sig Start Date End Date Taking? Authorizing Provider  cyclobenzaprine (FLEXERIL) 10 MG tablet Take 1 tablet (10 mg total) by mouth 2 (two) times daily as needed for muscle spasms. 09/28/15   Janne Napoleon, NP  HYDROcodone-acetaminophen (NORCO/VICODIN) 5-325 MG tablet Take 1 tablet by mouth every 4 (four) hours as needed. 09/28/15   Janne Napoleon, NP  ibuprofen (ADVIL,MOTRIN) 800 MG tablet Take 1 tablet (800 mg total) by mouth 3 (three) times daily. 09/17/15   Jacalyn Lefevre, MD  naproxen (NAPROSYN) 500 MG tablet Take 1 tablet (500 mg total) by mouth 2 (two) times daily. 09/28/15   Janne Napoleon, NP  sulfamethoxazole-trimethoprim (BACTRIM DS) 800-160 MG tablet Take 2 tablets by mouth 2 (two) times daily. 12/02/16   Jenette Rayson, Joni Reining, PA-C  traMADol (ULTRAM) 50 MG tablet Take 1 tablet (50 mg total) by mouth every 6 (six) hours as needed. 10/24/14   Pauline Aus, PA-C    Family History History reviewed. No pertinent family history.  Social History Social History  Substance Use Topics  . Smoking status: Current Every Day Smoker   Packs/day: 1.00    Types: Cigarettes  . Smokeless tobacco: Never Used  . Alcohol use 1.2 oz/week    2 Cans of beer per week     Comment: occ     Allergies   Patient has no active allergies.   Review of Systems Review of Systems  A complete review of systems was obtained and all systems are negative except as noted in the HPI and PMH.    Physical Exam Updated Vital Signs BP 119/87 (BP Location: Left Arm)   Pulse 75   Temp 98 F (36.7 C) (Oral)   Resp 16   Ht 5\' 7"  (1.702 m)   Wt 84 kg (185 lb 3.2 oz)   SpO2 100%   BMI 29.01 kg/m   Physical Exam  Constitutional: He is oriented to person, place, and time. He appears well-developed and well-nourished. No distress.  HENT:  Head: Normocephalic and atraumatic.  Mouth/Throat: Oropharynx is clear and moist.  Eyes: Pupils are equal, round, and reactive to light. Conjunctivae and EOM are normal.  Neck: Normal range of motion.  Cardiovascular: Normal rate, regular rhythm and intact distal pulses.   Pulmonary/Chest: Effort normal and breath sounds normal.  Abdominal: Soft. There is no tenderness.  Musculoskeletal: Normal range of motion.  Neurological: He is alert and oriented to person, place, and time.  Skin: He is not diaphoretic.     Psychiatric: He has a normal mood and  affect.  Nursing note and vitals reviewed.    ED Treatments / Results  Labs (all labs ordered are listed, but only abnormal results are displayed) Labs Reviewed - No data to display  EKG  EKG Interpretation None       Radiology No results found.  Procedures .Marland KitchenIncision and Drainage Date/Time: 12/02/2016 2:18 PM Performed by: Wynetta Emery Authorized by: Wynetta Emery   Consent:    Consent obtained:  Verbal Location:    Type:  Abscess   Location:  Head   Head location:  Scalp Pre-procedure details:    Skin preparation:  Chloraprep Anesthesia (see MAR for exact dosages):    Anesthesia method:  Local infiltration   Local  anesthetic:  Lidocaine 1% w/o epi Procedure type:    Complexity:  Complex Procedure details:    Incision types:  Single straight   Scalpel blade:  11   Wound management:  Probed and deloculated and irrigated with saline   Drainage:  Bloody   Drainage amount:  Moderate   Wound treatment:  Wound left open   Packing materials:  None Post-procedure details:    Patient tolerance of procedure:  Tolerated well, no immediate complications   (including critical care time)  Medications Ordered in ED Medications  lidocaine (PF) (XYLOCAINE) 1 % injection 5 mL (5 mLs Intradermal Given 12/02/16 0719)  sulfamethoxazole-trimethoprim (BACTRIM DS,SEPTRA DS) 800-160 MG per tablet 1 tablet (1 tablet Oral Given 12/02/16 0747)     Initial Impression / Assessment and Plan / ED Course  I have reviewed the triage vital signs and the nursing notes.  Pertinent labs & imaging results that were available during my care of the patient were reviewed by me and considered in my medical decision making (see chart for details).     Vitals:   12/02/16 0554  BP: 119/87  Pulse: 75  Resp: 16  Temp: 98 F (36.7 C)  TempSrc: Oral  SpO2: 100%  Weight: 84 kg (185 lb 3.2 oz)  Height: 5\' 7"  (1.702 m)    Medications  lidocaine (PF) (XYLOCAINE) 1 % injection 5 mL (5 mLs Intradermal Given 12/02/16 0719)  sulfamethoxazole-trimethoprim (BACTRIM DS,SEPTRA DS) 800-160 MG per tablet 1 tablet (1 tablet Oral Given 12/02/16 0747)    Greg Maldonado is 36 y.o. male presenting with Posterior neck abscess, but restarted is a folliculitis/ingrown hair. No signs of systemic infection. I and D performed with bloody drainage. Patient started on antibiotics and counseled on wound care and return precautions.  Evaluation does not show pathology that would require ongoing emergent intervention or inpatient treatment. Pt is hemodynamically stable and mentating appropriately. Discussed findings and plan with patient/guardian, who agrees  with care plan. All questions answered. Return precautions discussed and outpatient follow up given.      Final Clinical Impressions(s) / ED Diagnoses   Final diagnoses:  Abscess    New Prescriptions Discharge Medication List as of 12/02/2016  7:45 AM    START taking these medications   Details  sulfamethoxazole-trimethoprim (BACTRIM DS) 800-160 MG tablet Take 2 tablets by mouth 2 (two) times daily., Starting Wed 12/02/2016, Print         Kyliegh Jester, [place], PA-C 12/02/16 1419    Devoria Albe, MD 12/02/16 [phone]

## 2016-12-02 NOTE — ED Triage Notes (Signed)
Patient complaining of a bump that has grown bigger on the back of his neck. Patient states it started a week ago. It causing him trouble to move his neck around.

## 2016-12-02 NOTE — Discharge Instructions (Signed)
For pain control please take ibuprofen (also known as Motrin or Advil) 800mg  (this is normally 4 over the counter pills) 3 times a day  for 5 days. Take with food to minimize stomach irritation.  Take your antibiotics as directed and to completion. You should never have any leftover antibiotics! Push fluids and stay well hydrated.   Any antibiotic use can reduce the efficacy of hormonal birth control. Please use back up method of contraception.   Apply warm compresses to the area minimum of 4 times per day. If the pain increases sharply, if you develop fever, nausea or vomiting do not hesitate to return to the emergency room. If the area softens and feels like there is fluid underneath the surface of the skin return to the ED so we can lance the boil.

## 2016-12-02 NOTE — ED Notes (Signed)
Pt has a 2 cm abscess on the back of his neck that showed up yesterday. Hx of multiple abscesses.

## 2016-12-02 NOTE — ED Notes (Signed)
Bed: WTR5 Expected date:  Expected time:  Means of arrival:  Comments: 

## 2017-04-25 ENCOUNTER — Encounter (HOSPITAL_COMMUNITY): Payer: Self-pay | Admitting: *Deleted

## 2017-04-25 ENCOUNTER — Emergency Department (HOSPITAL_COMMUNITY)
Admission: EM | Admit: 2017-04-25 | Discharge: 2017-04-25 | Disposition: A | Discharge status: Home / Self Care | Payer: Self-pay | Attending: Emergency Medicine | Admitting: Emergency Medicine

## 2017-04-25 ENCOUNTER — Other Ambulatory Visit: Payer: Self-pay

## 2017-04-25 DIAGNOSIS — L03211 Cellulitis of face: Secondary | ICD-10-CM | POA: Insufficient documentation

## 2017-04-25 DIAGNOSIS — F17210 Nicotine dependence, cigarettes, uncomplicated: Secondary | ICD-10-CM | POA: Insufficient documentation

## 2017-04-25 MED ORDER — HYDROCODONE-ACETAMINOPHEN 5-325 MG PO TABS: 2.0000 | ORAL_TABLET | ORAL | 0 refills | Status: DC | PRN

## 2017-04-25 MED ORDER — CEFTRIAXONE SODIUM 1 G IJ SOLR
1.0000 g | Freq: Once | INTRAMUSCULAR | Status: AC
  Administered 2017-04-25: 1 g via INTRAMUSCULAR
  Filled 2017-04-25: qty 10

## 2017-04-25 MED ORDER — SULFAMETHOXAZOLE-TRIMETHOPRIM 800-160 MG PO TABS: 1.0000 | ORAL_TABLET | Freq: Two times a day (BID) | ORAL | 0 refills | Status: AC

## 2017-04-25 MED ORDER — CEPHALEXIN 500 MG PO CAPS: 500.0000 mg | ORAL_CAPSULE | Freq: Four times a day (QID) | ORAL | 0 refills | Status: DC

## 2017-04-25 MED ORDER — HYDROCODONE-ACETAMINOPHEN 5-325 MG PO TABS
2.0000 | ORAL_TABLET | Freq: Once | ORAL | Status: AC
  Administered 2017-04-25: 2 via ORAL
  Filled 2017-04-25: qty 2

## 2017-04-25 MED ORDER — STERILE WATER FOR INJECTION IJ SOLN
INTRAMUSCULAR | Status: AC
  Administered 2017-04-25: 10 mL
  Filled 2017-04-25: qty 10

## 2017-04-25 NOTE — Discharge Instructions (Addendum)
Go to Urgent care tomorrow for recheck

## 2017-04-25 NOTE — ED Notes (Signed)
Pt will be completely discharged 15 minutes per medication/dicharge protocol to verify no medication reaction has occurred.

## 2017-04-25 NOTE — ED Provider Notes (Signed)
[facility]-EMERGENCY DEPT Provider Note   CSN: [id number] Arrival date & time: 04/25/17  1552     History   Chief Complaint Chief Complaint  Patient presents with  . Abscess  . Oral Swelling    HPI Greg Maldonado is a 37 y.o. male.  The history is provided by the patient. No language interpreter was used.  Abscess  Location:  Mouth Mouth abscess location:  Upper outer lip Size:  4 Abscess quality: painful, redness and warmth   Red streaking: no   Duration:  2 days Progression:  Worsening Pain details:    Quality:  No pain   Severity:  Moderate   Duration:  2 days   Timing:  Constant   Progression:  Worsening Chronicity:  New Relieved by:  Nothing Worsened by:  Nothing Ineffective treatments:  None tried  Pt reports he had a pimple over his lip.  Pt reports he tried to pop it, now he has swelling and pain History reviewed. No pertinent past medical history.  There are no active problems to display for this patient.   History reviewed. No pertinent surgical history.     Home Medications    Prior to Admission medications   Medication Sig Start Date End Date Taking? Authorizing Provider  cephALEXin (KEFLEX) 500 MG capsule Take 1 capsule (500 mg total) by mouth 4 (four) times daily. 04/25/17   Elson AreasSofia, Kyuss Hale K, PA-C  cyclobenzaprine (FLEXERIL) 10 MG tablet Take 1 tablet (10 mg total) by mouth 2 (two) times daily as needed for muscle spasms. 09/28/15   Janne NapoleonNeese, Hope M, NP  HYDROcodone-acetaminophen (NORCO/VICODIN) 5-325 MG tablet Take 2 tablets by mouth every 4 (four) hours as needed. 04/25/17   Elson AreasSofia, Fianna Snowball K, PA-C  ibuprofen (ADVIL,MOTRIN) 800 MG tablet Take 1 tablet (800 mg total) by mouth 3 (three) times daily. 09/17/15   Jacalyn LefevreHaviland, Julie, MD  naproxen (NAPROSYN) 500 MG tablet Take 1 tablet (500 mg total) by mouth 2 (two) times daily. 09/28/15   Janne NapoleonNeese, Hope M, NP  sulfamethoxazole-trimethoprim (BACTRIM DS,SEPTRA DS) 800-160 MG tablet Take 1 tablet  by mouth 2 (two) times daily for 7 days. 04/25/17 05/02/17  Elson AreasSofia, Shaquel Chavous K, PA-C  traMADol (ULTRAM) 50 MG tablet Take 1 tablet (50 mg total) by mouth every 6 (six) hours as needed. 10/24/14   Triplett, Babette Relicammy, PA-C    Family History No family history on file.  Social History Social History   Tobacco Use  . Smoking status: Current Every Day Smoker    Packs/day: 1.00    Types: Cigarettes  . Smokeless tobacco: Never Used  Substance Use Topics  . Alcohol use: Yes    Alcohol/week: 1.2 oz    Types: 2 Cans of beer per week    Comment: occ  . Drug use: No     Allergies   Patient has no known allergies.   Review of Systems Review of Systems  All other systems reviewed and are negative.    Physical Exam Updated Vital Signs BP 124/79 (BP Location: Left Arm)   Pulse 74   Temp 98.7 F (37.1 C) (Oral)   Resp 18   Ht 5\' 7"  (1.702 m)   Wt 87.5 kg (193 lb)   SpO2 100%   BMI 30.23 kg/m   Physical Exam  Constitutional: He appears well-developed and well-nourished.  HENT:  Head: Normocephalic.  Mouth/Throat: Oropharynx is clear and moist.  4cm area of erythema and swelling upper lip,  No fluctuance  Eyes: Pupils are  equal, round, and reactive to light.  Neck: Normal range of motion.  Neurological: He is alert.  Skin: Skin is warm.  Psychiatric: He has a normal mood and affect.  Nursing note and vitals reviewed.    ED Treatments / Results  Labs (all labs ordered are listed, but only abnormal results are displayed) Labs Reviewed - No data to display  EKG  EKG Interpretation None       Radiology No results found.  Procedures Procedures (including critical care time)  Medications Ordered in ED Medications  cefTRIAXone (ROCEPHIN) injection 1 g (not administered)  HYDROcodone-acetaminophen (NORCO/VICODIN) 5-325 MG per tablet 2 tablet (not administered)     Initial Impression / Assessment and Plan / ED Course  I have reviewed the triage vital signs and the  nursing notes.  Pertinent labs & imaging results that were available during my care of the patient were reviewed by me and considered in my medical decision making (see chart for details).     Recheck in 24 hours Warm compresses  Final Clinical Impressions(s) / ED Diagnoses   Final diagnoses:  Cellulitis, face    ED Discharge Orders        Ordered    sulfamethoxazole-trimethoprim (BACTRIM DS,SEPTRA DS) 800-160 MG tablet  2 times daily     04/25/17 1637    cephALEXin (KEFLEX) 500 MG capsule  4 times daily     04/25/17 1637    HYDROcodone-acetaminophen (NORCO/VICODIN) 5-325 MG tablet  Every 4 hours PRN     04/25/17 1637    An After Visit Summary was printed and given to the patient.    Elson Areas, [place] 04/25/17 1644    Tegeler, Canary Brim, MD 04/25/17 2115

## 2017-04-25 NOTE — ED Triage Notes (Signed)
Pt "popped a pimple" on upper lip Friday, now has swelling and pain

## 2017-04-26 ENCOUNTER — Emergency Department (HOSPITAL_COMMUNITY)
Admission: EM | Admit: 2017-04-26 | Discharge: 2017-04-26 | Disposition: A | Discharge status: Home / Self Care | Payer: Self-pay | Attending: Emergency Medicine | Admitting: Emergency Medicine

## 2017-04-26 ENCOUNTER — Encounter (HOSPITAL_COMMUNITY): Payer: Self-pay | Admitting: Emergency Medicine

## 2017-04-26 DIAGNOSIS — R220 Localized swelling, mass and lump, head: Secondary | ICD-10-CM | POA: Insufficient documentation

## 2017-04-26 DIAGNOSIS — Z5321 Procedure and treatment not carried out due to patient leaving prior to being seen by health care provider: Secondary | ICD-10-CM | POA: Insufficient documentation

## 2017-04-26 NOTE — ED Triage Notes (Signed)
Patient c/o Friday he had pimple on upper lip area that popped and white discharge came out. Patient was seen couple times for it and was given antibiotics and given pain medication prescription. Patient reports PMH abscesses.

## 2017-04-26 NOTE — ED Provider Notes (Signed)
Patient placed in Quick Look pathway, seen and evaluated.  Chief Complaint: recheck of upper lip infection, swelling, seen yesterday  HPI:   Seen yesterday what appears to be upper lip cellulitis/abscess; here today for worsening pain, swelling, redness; has been taking abx but unable to get pain meds filled; seen yesterday at OSH also for pain  ROS: lip swelling and redness  Physical Exam:   Gen: No distress  Neuro: Awake and Alert  Skin: Warm  Focused Exam: swelling of upper lip at midline, erythema noted, TTP of area. No drainage noted, tolerating secretions, no respiratory distress, trismus   Initiation of care has begun. The patient has been counseled on the process, plan, and necessity for staying for the completion/evaluation, and the remainder of the medical screening examination   Dietrich PatesKhatri, Rylin Seavey, PA-C 04/26/17 1404    Pricilla LovelessGoldston, Scott, MD 04/26/17 1811

## 2021-03-09 ENCOUNTER — Emergency Department (HOSPITAL_COMMUNITY): Payer: Self-pay

## 2021-03-09 ENCOUNTER — Encounter (HOSPITAL_COMMUNITY): Payer: Self-pay | Admitting: Emergency Medicine

## 2021-03-09 ENCOUNTER — Emergency Department (HOSPITAL_COMMUNITY)
Admission: EM | Admit: 2021-03-09 | Discharge: 2021-03-09 | Disposition: A | Discharge status: Home / Self Care | Payer: Self-pay | Attending: Emergency Medicine | Admitting: Emergency Medicine

## 2021-03-09 DIAGNOSIS — W57XXXA Bitten or stung by nonvenomous insect and other nonvenomous arthropods, initial encounter: Secondary | ICD-10-CM | POA: Insufficient documentation

## 2021-03-09 DIAGNOSIS — F17210 Nicotine dependence, cigarettes, uncomplicated: Secondary | ICD-10-CM | POA: Insufficient documentation

## 2021-03-09 DIAGNOSIS — L0390 Cellulitis, unspecified: Secondary | ICD-10-CM

## 2021-03-09 DIAGNOSIS — L03114 Cellulitis of left upper limb: Secondary | ICD-10-CM | POA: Insufficient documentation

## 2021-03-09 DIAGNOSIS — S60463A Insect bite (nonvenomous) of left middle finger, initial encounter: Secondary | ICD-10-CM | POA: Insufficient documentation

## 2021-03-09 MED ORDER — IBUPROFEN 600 MG PO TABS: 600.0000 mg | ORAL_TABLET | Freq: Four times a day (QID) | ORAL | 0 refills | Status: DC | PRN

## 2021-03-09 MED ORDER — DOXYCYCLINE HYCLATE 100 MG PO CAPS
100.0000 mg | ORAL_CAPSULE | Freq: Two times a day (BID) | ORAL | 0 refills | Status: AC
Start: 2021-03-09 — End: 2021-03-14

## 2021-03-09 MED ORDER — DOXYCYCLINE HYCLATE 100 MG PO CAPS: 100.0000 mg | ORAL_CAPSULE | Freq: Two times a day (BID) | ORAL | 0 refills | Status: DC

## 2021-03-09 NOTE — ED Triage Notes (Signed)
PT noticed suspected insect bite with swelling to L middle finger yesterday. States pain is radiating up arm to L axilla.

## 2021-03-09 NOTE — ED Provider Notes (Signed)
[facility]-EMERGENCY DEPT Provider Note   CSN: [id number] Arrival date & time: 03/09/21  1134     History Chief Complaint  Patient presents with   Insect Bite    Paras Kloepfer is a 40 y.o. male presents to the ED complaining of insect bite to left lower middle finger onset yesterday.  Patient reports he noticed what appeared to be a pimple to the area that he popped prior to the onset of her symptoms.  Patient has associated swelling to the left middle finger.  He has not tried medications for his symptoms.  Patient denies fever, chills, chest pain, shortness of breath, abdominal pain, nausea, vomiting, wrist pain, forearm pain, upper arm pain.  Denies history of diabetes.    The history is provided by the patient. No language interpreter was used.      History reviewed. No pertinent past medical history.  There are no problems to display for this patient.   History reviewed. No pertinent surgical history.     No family history on file.  Social History   Tobacco Use   Smoking status: Every Day    Packs/day: 1.00    Types: Cigarettes   Smokeless tobacco: Never  Vaping Use   Vaping Use: Never used  Substance Use Topics   Alcohol use: Yes    Alcohol/week: 2.0 standard drinks    Types: 2 Cans of beer per week    Comment: occ   Drug use: No    Home Medications Prior to Admission medications   Medication Sig Start Date End Date Taking? Authorizing Provider  cephALEXin (KEFLEX) 500 MG capsule Take 1 capsule (500 mg total) by mouth 4 (four) times daily. 04/25/17   Elson Areas, PA-C  cyclobenzaprine (FLEXERIL) 10 MG tablet Take 1 tablet (10 mg total) by mouth 2 (two) times daily as needed for muscle spasms. 09/28/15   Janne Napoleon, NP  doxycycline (VIBRAMYCIN) 100 MG capsule Take 1 capsule (100 mg total) by mouth 2 (two) times daily for 5 days. 03/09/21 03/14/21  Sloan Leiter, DO  HYDROcodone-acetaminophen (NORCO/VICODIN) 5-325 MG tablet Take 2  tablets by mouth every 4 (four) hours as needed. 04/25/17   Elson Areas, PA-C  ibuprofen (ADVIL) 600 MG tablet Take 1 tablet (600 mg total) by mouth every 6 (six) hours as needed. 03/09/21   Sloan Leiter, DO  naproxen (NAPROSYN) 500 MG tablet Take 1 tablet (500 mg total) by mouth 2 (two) times daily. 09/28/15   Janne Napoleon, NP  traMADol (ULTRAM) 50 MG tablet Take 1 tablet (50 mg total) by mouth every 6 (six) hours as needed. 10/24/14   Triplett, Tammy, PA-C  famotidine (PEPCID) 20 MG tablet Take 1 tablet (20 mg total) by mouth 2 (two) times daily. Patient not taking: Reported on 07/16/2014 05/30/14 10/24/14  Pisciotta, Joni Reining, PA-C    Allergies    Patient has no known allergies.  Review of Systems   Review of Systems  Constitutional:  Negative for chills and fever.  Respiratory:  Negative for shortness of breath.   Cardiovascular:  Negative for chest pain.  Gastrointestinal:  Negative for abdominal pain, nausea and vomiting.  Musculoskeletal:  Positive for arthralgias and joint swelling.  Skin:  Negative for rash.  All other systems reviewed and are negative.  Physical Exam Updated Vital Signs BP 118/69   Pulse 100   Temp 98 F (36.7 C)   Resp 18   Ht 5\' 7"  (1.702 m)  Wt 86.2 kg   SpO2 99%   BMI 29.76 kg/m   Physical Exam Vitals and nursing note reviewed.  Constitutional:      General: He is not in acute distress.    Appearance: Normal appearance.  Eyes:     General: No scleral icterus.    Extraocular Movements: Extraocular movements intact.  Cardiovascular:     Rate and Rhythm: Normal rate.  Pulmonary:     Effort: Pulmonary effort is normal. No respiratory distress.  Musculoskeletal:     Right hand: Normal. No swelling, deformity, tenderness or bony tenderness. Normal range of motion. Normal strength. Normal sensation. Normal capillary refill. Normal pulse.     Left hand: Swelling and tenderness present. No deformity or bony tenderness. Normal range of motion.  Normal strength. Normal sensation. Normal capillary refill. Normal pulse.     Cervical back: Neck supple.     Comments: Minimal tenderness to palpation to left third proximal phalanx.  Swelling and erythema noted to left proximal phalanx extending to the dorsum of the base of the left third proximal phalanx.  No obvious deformity.  Full active range of motion of left hand, left digits, left wrist.  Able to pronate and supinate without pain.  No tenderness to palpation of left wrist, left forearm, left elbow, or left upper arm.  Strength and sensation intact to bilateral upper extremities.  See pictures below.  Skin:    General: Skin is warm and dry.     Findings: No bruising, erythema or rash.  Neurological:     Mental Status: He is alert.  Psychiatric:        Behavior: Behavior normal.         ED Results / Procedures / Treatments   Labs (all labs ordered are listed, but only abnormal results are displayed) Labs Reviewed - No data to display  EKG None  Radiology DG Hand Complete Left  Result Date: 03/09/2021 CLINICAL DATA:  Pt was bitten by an insect on Friday in between 3rd and 4th fingers on left hand. States pain is radiating up arm to L axilla. Left middle finger is swollen and red. EXAM: LEFT HAND - COMPLETE 3+ VIEW COMPARISON:  05/01/2014. FINDINGS: No fracture or bone lesion. Joints are normally spaced and aligned.  No arthropathic changes. Mild soft tissue swelling of the middle finger, most evident proximally. No radiopaque foreign body. IMPRESSION: 1. No fracture, bone lesion or joint abnormality. 2. Middle finger soft tissue swelling. Electronically Signed   By: Amie Portland M.D.   On: 03/09/2021 13:11    Procedures Procedures   Medications Ordered in ED Medications - No data to display  ED Course  I have reviewed the triage vital signs and the nursing notes.  Pertinent labs & imaging results that were available during my care of the patient were reviewed by me and  considered in my medical decision making (see chart for details).    MDM Rules/Calculators/A&P                         Patient with left middle finger pain onset yesterday.  Patient reports he had what he thought to be an insect bite to the area that appeared to the like a pimple that he popped prior to the onset of her symptoms. On exam, patient with mild  tenderness to palpation to left third proximal phalanx with swelling and erythema noted.  No obvious deformity.  Full range of motion of  left digits, wrist, elbow.  Sensation and pulses intact bilaterally to upper extremities. Differential diagnosis includes cellulitis, septic arthritis, insect bite.  Left hand xray negative for acute fractures, dislocations, or gas noted.  Patient afebrile with vital signs within normal limits, this is less likely septic arthritis.  Patient presentation suspicious for cellulitis secondary to picking at questionable insect bite.  Patient given prescription for doxycycline and instructed to complete the entire course. Strict return precautions provided to patient regarding fever, increasing/worsening swelling, color change, or gait issue.  Supportive care and return precautions discussed with patient.  Patient acknowledges and voices understanding.  Patient appears safe for discharge at this time.  Follow-up as indicated in discharge paperwork.  Final Clinical Impression(s) / ED Diagnoses Final diagnoses:  Cellulitis, unspecified cellulitis site    Rx / DC Orders ED Discharge Orders          Ordered    doxycycline (VIBRAMYCIN) 100 MG capsule  2 times daily,   Status:  Discontinued        03/09/21 1327    ibuprofen (ADVIL) 600 MG tablet  Every 6 hours PRN,   Status:  Discontinued        03/09/21 1327    doxycycline (VIBRAMYCIN) 100 MG capsule  2 times daily        03/09/21 1527    ibuprofen (ADVIL) 600 MG tablet  Every 6 hours PRN        03/09/21 1527             Gabriell Casimir A, PA-C 03/09/21  1853    Sloan Leiter, DO 03/13/21 0003

## 2021-03-09 NOTE — Discharge Instructions (Addendum)
Your x-ray today was negative for fracture or dislocation.  You will be prescribed doxycycline.  It is important that you complete the entire dose.  You are prescribed ibuprofen, take as prescribed.  Follow-up with your primary care provider as needed.  You may use ice or heat to the affected area for 15 minutes at a time.  Ensure to place a barrier between your skin and the ice or heat.  Return to the emergency department if you are experiencing increasing/worsening swelling, increasing redness, inability to move your hand due to symptoms.

## 2021-03-10 ENCOUNTER — Encounter (HOSPITAL_COMMUNITY): Payer: Self-pay

## 2021-03-10 ENCOUNTER — Emergency Department (HOSPITAL_COMMUNITY)
Admission: EM | Admit: 2021-03-10 | Discharge: 2021-03-11 | Disposition: A | Discharge status: Home / Self Care | Payer: Self-pay | Attending: Emergency Medicine | Admitting: Emergency Medicine

## 2021-03-10 ENCOUNTER — Other Ambulatory Visit: Payer: Self-pay

## 2021-03-10 DIAGNOSIS — Z5321 Procedure and treatment not carried out due to patient leaving prior to being seen by health care provider: Secondary | ICD-10-CM | POA: Insufficient documentation

## 2021-03-10 DIAGNOSIS — L02512 Cutaneous abscess of left hand: Secondary | ICD-10-CM | POA: Insufficient documentation

## 2021-03-10 LAB — CBC WITH DIFFERENTIAL/PLATELET
Abs Immature Granulocytes: 0.04 10*3/uL (ref 0.00–0.07)
Basophils Absolute: 0 10*3/uL (ref 0.0–0.1)
Basophils Relative: 0 %
Eosinophils Absolute: 0.2 10*3/uL (ref 0.0–0.5)
Eosinophils Relative: 1 %
HCT: 44.9 % (ref 39.0–52.0)
Hemoglobin: 14.8 g/dL (ref 13.0–17.0)
Immature Granulocytes: 0 %
Lymphocytes Relative: 13 %
Lymphs Abs: 1.5 10*3/uL (ref 0.7–4.0)
MCH: 32.4 pg (ref 26.0–34.0)
MCHC: 33 g/dL (ref 30.0–36.0)
MCV: 98.2 fL (ref 80.0–100.0)
Monocytes Absolute: 1 10*3/uL (ref 0.1–1.0)
Monocytes Relative: 9 %
Neutro Abs: 8.9 10*3/uL — ABNORMAL HIGH (ref 1.7–7.7)
Neutrophils Relative %: 77 %
Platelets: 306 10*3/uL (ref 150–400)
RBC: 4.57 MIL/uL (ref 4.22–5.81)
RDW: 13 % (ref 11.5–15.5)
WBC: 11.5 10*3/uL — ABNORMAL HIGH (ref 4.0–10.5)
nRBC: 0 % (ref 0.0–0.2)

## 2021-03-10 NOTE — ED Triage Notes (Signed)
Pt was seen yesterday for an abscess to his middle finger left hand that started on Friday. Pt states that his condition is worsening and fluid is coming out.

## 2021-03-10 NOTE — ED Provider Notes (Signed)
Emergency Medicine Provider Triage Evaluation Note  Greg Maldonado , a 40 y.o. male  was evaluated in triage.  Pt complains of left hand ringer finger swelling and pain. Started Friday and worsened since. Evaluated in ED yesterday. Started on doxycycline. Reports worse pain and swelling.   Review of Systems  Positive: Pain and swelling Negative: fever  Physical Exam  BP 114/69 (BP Location: Right Arm)   Pulse 89   Temp 98.1 F (36.7 C) (Oral)   Resp 16   SpO2 97%  Gen:   Awake, no distress   Resp:  Normal effort MSK:   Moves extremities without difficulty  Other:  Left hand 3rd finger erythema, swelling, and drainage primarily between MCP and PIP joint.   Medical Decision Making  Medically screening exam initiated at 8:55 PM.  Appropriate orders placed.  Greg Maldonado was informed that the remainder of the evaluation will be completed by another provider, this initial triage assessment does not replace that evaluation, and the importance of remaining in the ED until their evaluation is complete.     Marita Kansas, PA-C 03/10/21 2057    Terald Sleeper, MD 03/10/21 [phone]

## 2022-12-30 ENCOUNTER — Encounter (HOSPITAL_COMMUNITY): Payer: Self-pay

## 2022-12-30 ENCOUNTER — Other Ambulatory Visit: Payer: Self-pay

## 2022-12-30 ENCOUNTER — Emergency Department (HOSPITAL_COMMUNITY)
Admission: EM | Admit: 2022-12-30 | Discharge: 2022-12-31 | Disposition: A | Discharge status: Home / Self Care | Payer: 59 | Attending: Emergency Medicine | Admitting: Emergency Medicine

## 2022-12-30 DIAGNOSIS — L02811 Cutaneous abscess of head [any part, except face]: Secondary | ICD-10-CM | POA: Insufficient documentation

## 2022-12-30 LAB — CBC WITH DIFFERENTIAL/PLATELET
Abs Immature Granulocytes: 0.01 10*3/uL (ref 0.00–0.07)
Basophils Absolute: 0 10*3/uL (ref 0.0–0.1)
Basophils Relative: 1 %
Eosinophils Absolute: 0.2 10*3/uL (ref 0.0–0.5)
Eosinophils Relative: 4 %
HCT: 42.6 % (ref 39.0–52.0)
Hemoglobin: 14 g/dL (ref 13.0–17.0)
Immature Granulocytes: 0 %
Lymphocytes Relative: 26 %
Lymphs Abs: 1.8 10*3/uL (ref 0.7–4.0)
MCH: 30.8 pg (ref 26.0–34.0)
MCHC: 32.9 g/dL (ref 30.0–36.0)
MCV: 93.8 fL (ref 80.0–100.0)
Monocytes Absolute: 0.5 10*3/uL (ref 0.1–1.0)
Monocytes Relative: 7 %
Neutro Abs: 4.2 10*3/uL (ref 1.7–7.7)
Neutrophils Relative %: 62 %
Platelets: 319 10*3/uL (ref 150–400)
RBC: 4.54 MIL/uL (ref 4.22–5.81)
RDW: 12.7 % (ref 11.5–15.5)
WBC: 6.8 10*3/uL (ref 4.0–10.5)
nRBC: 0 % (ref 0.0–0.2)

## 2022-12-30 LAB — COMPREHENSIVE METABOLIC PANEL WITH GFR
ALT: 23 U/L (ref 0–44)
AST: 23 U/L (ref 15–41)
Albumin: 3.7 g/dL (ref 3.5–5.0)
Alkaline Phosphatase: 74 U/L (ref 38–126)
Anion gap: 9 (ref 5–15)
BUN: 18 mg/dL (ref 6–20)
CO2: 21 mmol/L — ABNORMAL LOW (ref 22–32)
Calcium: 8.6 mg/dL — ABNORMAL LOW (ref 8.9–10.3)
Chloride: 104 mmol/L (ref 98–111)
Creatinine, Ser: 0.92 mg/dL (ref 0.61–1.24)
GFR, Estimated: >60 mL/min (ref >60)
Glucose, Bld: 107 mg/dL — ABNORMAL HIGH (ref 70–99)
Potassium: 3.7 mmol/L (ref 3.5–5.1)
Sodium: 134 mmol/L — ABNORMAL LOW (ref 135–145)
Total Bilirubin: 0.7 mg/dL (ref 0.3–1.2)
Total Protein: 7.8 g/dL (ref 6.5–8.1)

## 2022-12-30 LAB — I-STAT CG4 LACTIC ACID, ED: Lactic Acid, Venous: 0.7 mmol/L (ref 0.5–1.9)

## 2022-12-30 MED ORDER — LIDOCAINE-EPINEPHRINE (PF) 2 %-1:200000 IJ SOLN
10.0000 mL | Freq: Once | INTRAMUSCULAR | Status: AC
  Administered 2022-12-31: 10 mL
  Filled 2022-12-30: qty 20

## 2022-12-30 NOTE — ED Triage Notes (Signed)
Pt reports ingrown hair to top of head since last week, pt now has swollen left side of face and jaw. Swelling started Tuesday. Denies fever. States he had chills yesterday.

## 2022-12-30 NOTE — ED Provider Notes (Signed)
Greg Maldonado EMERGENCY DEPARTMENT AT [facility] Provider Note   CSN: [id number] Arrival date & time: 12/30/22  1812     History {Add pertinent medical, surgical, social history, OB history to HPI:1} Chief Complaint  Patient presents with  . Abscess    Greg Maldonado is a 42 y.o. male.   Abscess      Home Medications Prior to Admission medications   Medication Sig Start Date End Date Taking? Authorizing Provider  cephALEXin (KEFLEX) 500 MG capsule Take 1 capsule (500 mg total) by mouth 4 (four) times daily. 04/25/17   Elson Areas, PA-C  cyclobenzaprine (FLEXERIL) 10 MG tablet Take 1 tablet (10 mg total) by mouth 2 (two) times daily as needed for muscle spasms. 09/28/15   Janne Napoleon, NP  HYDROcodone-acetaminophen (NORCO/VICODIN) 5-325 MG tablet Take 2 tablets by mouth every 4 (four) hours as needed. 04/25/17   Elson Areas, PA-C  ibuprofen (ADVIL) 600 MG tablet Take 1 tablet (600 mg total) by mouth every 6 (six) hours as needed. 03/09/21   Sloan Leiter, DO  naproxen (NAPROSYN) 500 MG tablet Take 1 tablet (500 mg total) by mouth 2 (two) times daily. 09/28/15   Janne Napoleon, NP  traMADol (ULTRAM) 50 MG tablet Take 1 tablet (50 mg total) by mouth every 6 (six) hours as needed. 10/24/14   Triplett, Tammy, PA-C  famotidine (PEPCID) 20 MG tablet Take 1 tablet (20 mg total) by mouth 2 (two) times daily. Patient not taking: Reported on 07/16/2014 05/30/14 10/24/14  Pisciotta, Joni Reining, PA-C      Allergies    Patient has no known allergies.    Review of Systems   Review of Systems  Physical Exam Updated Vital Signs BP 131/80   Pulse 90   Temp 98.2 F (36.8 C) (Oral)   Resp 18   Ht 5\' 7"  (1.702 m)   Wt 90.7 kg   SpO2 99%   BMI 31.32 kg/m  Physical Exam  ED Results / Procedures / Treatments   Labs (all labs ordered are listed, but only abnormal results are displayed) Labs Reviewed  COMPREHENSIVE METABOLIC PANEL - Abnormal; Notable for the following  components:      Result Value   Sodium 134 (*)    CO2 21 (*)    Glucose, Bld 107 (*)    Calcium 8.6 (*)    All other components within normal limits  CBC WITH DIFFERENTIAL/PLATELET  I-STAT CG4 LACTIC ACID, ED  I-STAT CG4 LACTIC ACID, ED    EKG None  Radiology No results found.  Procedures Procedures  {Document cardiac monitor, telemetry assessment procedure when appropriate:1}  Medications Ordered in ED Medications  lidocaine-EPINEPHrine (XYLOCAINE W/EPI) 2 %-1:200000 (PF) injection 10 mL (has no administration in time range)    ED Course/ Medical Decision Making/ A&P   {   Click here for ABCD2, HEART and other calculatorsREFRESH Note before signing :1}                              Medical Decision Making Amount and/or Complexity of Data Reviewed Labs: ordered.  Risk Prescription drug management.   ***  {Document critical care time when appropriate:1} {Document review of labs and clinical decision tools ie heart score, Chads2Vasc2 etc:1}  {Document your independent review of radiology images, and any outside records:1} {Document your discussion with family members, caretakers, and with consultants:1} {Document social determinants of health affecting pt's care:1} {Document  your decision making why or why not admission, treatments were needed:1} Final Clinical Impression(s) / ED Diagnoses Final diagnoses:  None    Rx / DC Orders ED Discharge Orders     None

## 2022-12-31 MED ORDER — DOXYCYCLINE HYCLATE 100 MG PO CAPS: 100.0000 mg | ORAL_CAPSULE | Freq: Two times a day (BID) | ORAL | 0 refills | Status: DC

## 2022-12-31 NOTE — ED Notes (Signed)
Bandaid given to pt/ pt states he will put on his head before bed

## 2022-12-31 NOTE — Discharge Instructions (Signed)
You were seen in the emergency department for an abscess.  We have drained the area and cleaned it. I would like you to have the wound checked in 2-3 days. This can be done by any doctor's office, urgent care, or emergency department. This is to make sure the area hasn't closed too soon. Try to keep the area as clean and dry as possible. It is okay to let warm soapy water run over the area, but do NOT scrub the area.   I am placing you on a course of antibiotics. It is important you finish the entire course! You can take ibuprofen or tylenol as needed for pain.   You can use an antiseptic (chlorhexidine) soap from the pharmacy 1-2 x per month in the areas where abscesses are most likely to form (armpits, buttocks, groin). This soap can dry your skin out so use it sparingly. Once do so once this area has fully healed.   Continue to monitor how you're doing and return to the ER for new or worsening symptoms.

## 2023-02-02 ENCOUNTER — Telehealth: Payer: Self-pay | Admitting: *Deleted

## 2023-02-02 NOTE — Telephone Encounter (Signed)
Transition Care Management Unsuccessful Follow-up Telephone Call  Date of discharge and from where:  [facility]  12/31/2022  Attempts:  1st Attempt  Reason for unsuccessful TCM follow-up call:No answer

## 2023-02-04 ENCOUNTER — Telehealth: Payer: Self-pay | Admitting: *Deleted

## 2023-02-04 NOTE — Telephone Encounter (Signed)
Transition Care Management Unsuccessful Follow-up Telephone Call  Date of discharge and from where:  [facility]  12/31/2022  Attempts:  2nd Attempt  Reason for unsuccessful TCM follow-up call:  No answer/busy

## 2023-05-04 ENCOUNTER — Emergency Department (HOSPITAL_COMMUNITY)
Admission: EM | Admit: 2023-05-04 | Discharge: 2023-05-04 | Disposition: A | Discharge status: Home / Self Care | Payer: Self-pay | Attending: Emergency Medicine | Admitting: Emergency Medicine

## 2023-05-04 ENCOUNTER — Emergency Department (HOSPITAL_COMMUNITY): Payer: Self-pay

## 2023-05-04 ENCOUNTER — Other Ambulatory Visit: Payer: Self-pay

## 2023-05-04 DIAGNOSIS — K625 Hemorrhage of anus and rectum: Secondary | ICD-10-CM | POA: Insufficient documentation

## 2023-05-04 LAB — COMPREHENSIVE METABOLIC PANEL
ALT: 22 U/L (ref 0–44)
AST: 23 U/L (ref 15–41)
Albumin: 3.9 g/dL (ref 3.5–5.0)
Alkaline Phosphatase: 67 U/L (ref 38–126)
Anion gap: 9 (ref 5–15)
BUN: 9 mg/dL (ref 6–20)
CO2: 23 mmol/L (ref 22–32)
Calcium: 9.5 mg/dL (ref 8.9–10.3)
Chloride: 104 mmol/L (ref 98–111)
Creatinine, Ser: 0.86 mg/dL (ref 0.61–1.24)
GFR, Estimated: >60 mL/min (ref >60)
Glucose, Bld: 99 mg/dL (ref 70–99)
Potassium: 3.7 mmol/L (ref 3.5–5.1)
Sodium: 136 mmol/L (ref 135–145)
Total Bilirubin: 0.7 mg/dL (ref 0.0–1.2)
Total Protein: 7.5 g/dL (ref 6.5–8.1)

## 2023-05-04 LAB — CBC
HCT: 46.6 % (ref 39.0–52.0)
Hemoglobin: 15.5 g/dL (ref 13.0–17.0)
MCH: 30.6 pg (ref 26.0–34.0)
MCHC: 33.3 g/dL (ref 30.0–36.0)
MCV: 91.9 fL (ref 80.0–100.0)
Platelets: 350 10*3/uL (ref 150–400)
RBC: 5.07 MIL/uL (ref 4.22–5.81)
RDW: 12.8 % (ref 11.5–15.5)
WBC: 8.9 10*3/uL (ref 4.0–10.5)
nRBC: 0 % (ref 0.0–0.2)

## 2023-05-04 LAB — TYPE AND SCREEN
ABO/RH(D): O POS
Antibody Screen: NEGATIVE

## 2023-05-04 LAB — POC OCCULT BLOOD, ED: Fecal Occult Bld: POSITIVE — AB

## 2023-05-04 MED ORDER — IOHEXOL 350 MG/ML SOLN
75.0000 mL | Freq: Once | INTRAVENOUS | Status: AC | PRN
  Administered 2023-05-04: 75 mL via INTRAVENOUS

## 2023-05-04 MED ORDER — ACETAMINOPHEN 500 MG PO TABS
1000.0000 mg | ORAL_TABLET | Freq: Four times a day (QID) | ORAL | Status: DC | PRN
Start: 2023-05-04 — End: 2023-05-04
  Administered 2023-05-04: 1000 mg via ORAL
  Filled 2023-05-04: qty 2

## 2023-05-04 NOTE — Discharge Instructions (Signed)
Your tests in the emergency department are all negative and do not identify the source of the rectal bleeding. You can be discharged home.   Please call [facility] for an appointment to continue the investigation in the outpatient setting.   Return to the ED with any significant blood loss, dizziness, passing out, severe pain or new concern.

## 2023-05-04 NOTE — ED Triage Notes (Signed)
Pt. Stated, Greg Maldonado got blood in my colon, Even when I get up Im leaking from my rectum its blood that comes out, this started about 5 months. Its gotten worse in the last 2 days with blood a lot . I have pain in the left lower quadrant.

## 2023-05-04 NOTE — ED Provider Notes (Signed)
[place] EMERGENCY DEPARTMENT AT [facility] Provider Note   CSN: [id number] Arrival date & time: 05/04/23  4098     History  Chief Complaint  Patient presents with   Rectal Bleeding   Back Pain   Abdominal Pain    Greg Maldonado is a 43 y.o. male.  Patient to ED with BRB per rectum for the past several days. He reports on and off bleeding for the past 5 months, but over the last 2 days is "leaking" blood without bowel movement. He reports LLQ abdominal pain x 2 days as well. No fever, vomiting. No urinary symptoms. He denies dizziness or near syncope.   The history is provided by the patient and the spouse. No language interpreter was used.  Rectal Bleeding Associated symptoms: abdominal pain   Back Pain Associated symptoms: abdominal pain   Abdominal Pain Associated symptoms: hematochezia        Home Medications Prior to Admission medications   Medication Sig Start Date End Date Taking? Authorizing Provider  cephALEXin (KEFLEX) 500 MG capsule Take 1 capsule (500 mg total) by mouth 4 (four) times daily. 04/25/17   Elson Areas, PA-C  cyclobenzaprine (FLEXERIL) 10 MG tablet Take 1 tablet (10 mg total) by mouth 2 (two) times daily as needed for muscle spasms. 09/28/15   Janne Napoleon, NP  doxycycline (VIBRAMYCIN) 100 MG capsule Take 1 capsule (100 mg total) by mouth 2 (two) times daily. 12/31/22   Smoot, Shawn Route, PA-C  HYDROcodone-acetaminophen (NORCO/VICODIN) 5-325 MG tablet Take 2 tablets by mouth every 4 (four) hours as needed. 04/25/17   Elson Areas, PA-C  ibuprofen (ADVIL) 600 MG tablet Take 1 tablet (600 mg total) by mouth every 6 (six) hours as needed. 03/09/21   Sloan Leiter, DO  naproxen (NAPROSYN) 500 MG tablet Take 1 tablet (500 mg total) by mouth 2 (two) times daily. 09/28/15   Janne Napoleon, NP  traMADol (ULTRAM) 50 MG tablet Take 1 tablet (50 mg total) by mouth every 6 (six) hours as needed. 10/24/14   Triplett, Tammy, PA-C  famotidine (PEPCID) 20  MG tablet Take 1 tablet (20 mg total) by mouth 2 (two) times daily. Patient not taking: Reported on 07/16/2014 05/30/14 10/24/14  Pisciotta, Joni Reining, PA-C      Allergies    Patient has no known allergies.    Review of Systems   Review of Systems  Gastrointestinal:  Positive for abdominal pain and hematochezia.  Musculoskeletal:  Positive for back pain.    Physical Exam Updated Vital Signs BP (!) 136/94   Pulse 88   Temp (!) 96.3 F (35.7 C) (Temporal)   Resp 16   Ht 5\' 7"  (1.702 m)   Wt 93.9 kg   SpO2 99%   BMI 32.42 kg/m  Physical Exam Constitutional:      Appearance: He is well-developed.  Pulmonary:     Effort: Pulmonary effort is normal.  Abdominal:     General: There is no distension.     Palpations: Abdomen is soft.     Tenderness: There is abdominal tenderness in the left lower quadrant. There is no left CVA tenderness.  Musculoskeletal:        General: Normal range of motion.     Cervical back: Normal range of motion.  Skin:    General: Skin is warm and dry.     Comments: Large soft, mobile, nontender mass posterior to the left axilla c/w lipoma.   Neurological:  Mental Status: He is alert and oriented to person, place, and time.     ED Results / Procedures / Treatments   Labs (all labs ordered are listed, but only abnormal results are displayed) Labs Reviewed  POC OCCULT BLOOD, ED - Abnormal; Notable for the following components:      Result Value   Fecal Occult Bld POSITIVE (*)    All other components within normal limits  COMPREHENSIVE METABOLIC PANEL  CBC  URINALYSIS, ROUTINE W REFLEX MICROSCOPIC  TYPE AND SCREEN  ABO/RH   Results for orders placed or performed during the hospital encounter of 05/04/23  Type and screen [facility]   Collection Time: 05/04/23  9:02 AM  Result Value Ref Range   ABO/RH(D) O POS    Antibody Screen NEG    Sample Expiration      05/07/2023,2359 Performed at [facility], [address] [place], [place] [address]   Comprehensive metabolic panel   Collection Time: 05/04/23  9:03 AM  Result Value Ref Range   Sodium 136 135 - 145 mmol/L   Potassium 3.7 3.5 - 5.1 mmol/L   Chloride 104 98 - 111 mmol/L   CO2 23 22 - 32 mmol/L   Glucose, Bld 99 70 - 99 mg/dL   BUN 9 6 - 20 mg/dL   Creatinine, Ser 0.27 0.61 - 1.24 mg/dL   Calcium 9.5 8.9 - 25.3 mg/dL   Total Protein 7.5 6.5 - 8.1 g/dL   Albumin 3.9 3.5 - 5.0 g/dL   AST 23 15 - 41 U/L   ALT 22 0 - 44 U/L   Alkaline Phosphatase 67 38 - 126 U/L   Total Bilirubin 0.7 0.0 - 1.2 mg/dL   GFR, Estimated >66 >44 mL/min   Anion gap 9 5 - 15  CBC   Collection Time: 05/04/23  9:03 AM  Result Value Ref Range   WBC 8.9 4.0 - 10.5 K/uL   RBC 5.07 4.22 - 5.81 MIL/uL   Hemoglobin 15.5 13.0 - 17.0 g/dL   HCT 03.4 74.2 - 59.5 %   MCV 91.9 80.0 - 100.0 fL   MCH 30.6 26.0 - 34.0 pg   MCHC 33.3 30.0 - 36.0 g/dL   RDW 63.8 75.6 - 43.3 %   Platelets 350 150 - 400 K/uL   nRBC 0.0 0.0 - 0.2 %  POC occult blood, ED   Collection Time: 05/04/23 10:35 AM  Result Value Ref Range   Fecal Occult Bld POSITIVE (A) NEGATIVE    EKG None  Radiology CT ABDOMEN PELVIS W CONTRAST Result Date: 05/04/2023 CLINICAL DATA:  Left-sided abdominal pain radiating to the back for 2 days. EXAM: CT ABDOMEN AND PELVIS WITH CONTRAST TECHNIQUE: Multidetector CT imaging of the abdomen and pelvis was performed using the standard protocol following bolus administration of intravenous contrast. RADIATION DOSE REDUCTION: This exam was performed according to the departmental dose-optimization program which includes automated exposure control, adjustment of the mA and/or kV according to patient size and/or use of iterative reconstruction technique. CONTRAST:  75mL OMNIPAQUE IOHEXOL 350 MG/ML SOLN COMPARISON:  None Available. FINDINGS: Lower chest: Clear lung bases. Hepatobiliary: No focal liver abnormality is seen. No gallstones, gallbladder wall thickening, or biliary  dilatation. Pancreas: Unremarkable. No pancreatic ductal dilatation or surrounding inflammatory changes. Spleen: Normal in size without focal abnormality. Adrenals/Urinary Tract: Adrenal glands are unremarkable. Kidneys are normal, without renal calculi, focal lesion, or hydronephrosis. Bladder is unremarkable. Stomach/Bowel: Normal stomach and small bowel. Colon is  normal in caliber. Few small diverticula noted along the lower descending and sigmoid colon. No evidence of diverticulitis. No colonic wall thickening or other inflammatory process. Normal appendix. Vascular/Lymphatic: Mild infrarenal abdominal aortic atherosclerosis. No aneurysm. No enlarged lymph nodes. Reproductive: Unremarkable. Other: No abdominal wall hernia or abnormality. No abdominopelvic ascites. Musculoskeletal: No fracture or acute finding. No bone lesion. Significant disc degenerative changes at L4-L5. IMPRESSION: 1. No acute findings. No findings to account for abdominal pain. Specifically, no evidence of diverticulitis. 2. Mild aortic atherosclerosis. Electronically Signed   By: Amie Portland M.D.   On: 05/04/2023 12:53    Procedures Procedures    Medications Ordered in ED Medications  acetaminophen (TYLENOL) tablet 1,000 mg (1,000 mg Oral Given 05/04/23 1039)  iohexol (OMNIPAQUE) 350 MG/ML injection 75 mL (75 mLs Intravenous Contrast Given 05/04/23 1222)    ED Course/ Medical Decision Making/ A&P                                 Medical Decision Making This patient presents to the ED for concern of rectal bleeding, this involves an extensive number of treatment options, and is a complaint that carries with it a high risk of complications and morbidity.  The differential diagnosis includes hemorrhoids, diverticulitis, colitis, mass/CA   Co morbidities that complicate the patient evaluation  Otherwise healthy 43 yo patient   Additional history obtained:  Additional history and/or information obtained from chart  review, notable for N/A. He denies family history of bowel CA, diseases.    Lab Tests:  I Ordered, and personally interpreted labs.  The pertinent results include:  Hemoccult grossly positive, hgb 15.5, normal WBC, O positive blood type, no electrolyte abnormalities.     Imaging Studies ordered:  I ordered imaging studies including CT abd/pel I independently visualized and interpreted imaging which showed No acute findings per radiologist interpretation   Medicines ordered and prescription drug management:  I ordered medication including tylenol  for pain (as requested by the patient) Reevaluation of the patient after these medicines showed that the patient improved I have reviewed the patients home medicines and have made adjustments as needed   Test Considered:     Critical Interventions:       Problem List / ED Course:  Patient with on and off rectal bleeding x months, now worsening amount of bleeding and afebrile LLQ abdominal pain   Reevaluation:  After the interventions noted above, I reevaluated the patient and found that they have :improved   Social Determinants of Health:  Poor access to health care   Disposition:  After consideration of the diagnostic results and the patients response to treatment, I feel that the patient would benefit from stable for discharge home and outpatient follow up with GI.   Amount and/or Complexity of Data Reviewed Labs: ordered. Radiology: ordered.  Risk OTC drugs. Prescription drug management.           Final Clinical Impression(s) / ED Diagnoses Final diagnoses:  Rectal bleeding    Rx / DC Orders ED Discharge Orders     None         Elpidio Anis, PA-C 05/04/23 1309    Elayne Snare K, DO 05/04/23 1525

## 2023-06-09 ENCOUNTER — Ambulatory Visit (INDEPENDENT_AMBULATORY_CARE_PROVIDER_SITE_OTHER): Payer: Self-pay | Admitting: Physician Assistant

## 2023-06-09 ENCOUNTER — Encounter: Payer: Self-pay | Admitting: Physician Assistant

## 2023-06-09 VITALS — BP 108/68 | HR 83 | Ht 67.0 in | Wt 208.8 lb

## 2023-06-09 DIAGNOSIS — K5730 Diverticulosis of large intestine without perforation or abscess without bleeding: Secondary | ICD-10-CM

## 2023-06-09 DIAGNOSIS — K625 Hemorrhage of anus and rectum: Secondary | ICD-10-CM

## 2023-06-09 DIAGNOSIS — K649 Unspecified hemorrhoids: Secondary | ICD-10-CM

## 2023-06-09 MED ORDER — SUFLAVE 178.7 G PO SOLR: 1.0000 | Freq: Once | ORAL | 0 refills | Status: AC

## 2023-06-09 MED ORDER — HYDROCORTISONE ACETATE 25 MG RE SUPP: 25.0000 mg | Freq: Two times a day (BID) | RECTAL | 0 refills | Status: AC

## 2023-06-09 NOTE — Patient Instructions (Addendum)
 You have been scheduled for a colonoscopy. Please follow written instructions given to you at your visit today.   If you use inhalers (even only as needed), please bring them with you on the day of your procedure.  DO NOT TAKE 7 DAYS PRIOR TO TEST- Trulicity (dulaglutide) Ozempic, Wegovy (semaglutide) Mounjaro (tirzepatide) Bydureon Bcise (exanatide extended release)  DO NOT TAKE 1 DAY PRIOR TO YOUR TEST Rybelsus (semaglutide) Adlyxin (lixisenatide) Victoza (liraglutide) Byetta (exanatide) ___________________________________________________________________________  Greg Maldonado will receive your bowel preparation through [facility], which ensures the lowest copay and home delivery, with outreach via text or call from an [phone] number. Please respond promptly to avoid rescheduling of your procedure. If you are interested in alternative options or have any questions regarding your prep, please contact them at [phone] ____________________________________________________________________________  Your Provider Has Sent Your Bowel Prep Regimen To [facility]   [facility] will contact you to verify your information and collect your copay, if applicable. Enjoy the comfort of your home while your prescription is mailed to you, FREE of any shipping charges.   [facility] accepts all major insurance benefits and applies discounts & coupons.  Have additional questions?   Chat: [email or web address] Call: [phone] Email: [email or web address] NCPDP: [id number]  How will [facility] contact you?  With a Welcome phone call,  a Welcome text and a checkout link in text form.  Texts you receive from [phone] Are NOT Spam.  *To set up delivery, you must complete the checkout process via link or speak to one of the patient care representatives. If [facility] is unable to reach you, your prescription may be delayed.  To avoid long hold times on the phone, you may also utilize the secure chat  feature on the [facility] website to request that they call you back for transaction completion or to expedite your concerns.   Please do sitz baths- these can be found at the pharmacy. It is a Chief Operating Officer that is put in your toliet.  Please increase fiber or add benefiber, increase water and increase acitivity.  Will send in hydrocoritsone suppository, cheapest with [employer] from [facility], [facility] If the hemorrhoid suppository sent in is too expensive you can do this over the counter trick.  Apply a pea size amount of generic prescription Anusol HC cream that has been sent into your pharmacy to the tip of an over the counter PrepH suppository and insert rectally once every night for at least 7 nights.  If this does not improve there are procedures that can be done.   About Hemorrhoids  Hemorrhoids are swollen veins in the lower rectum and anus.  Also called piles, hemorrhoids are a common problem.  Hemorrhoids may be internal (inside the rectum) or external (around the anus).  Internal Hemorrhoids  Internal hemorrhoids are often painless, but they rarely cause bleeding.  The internal veins may stretch and fall down (prolapse) through the anus to the outside of the body.  The veins may then become irritated and painful.  External Hemorrhoids  External hemorrhoids can be easily seen or felt around the anal opening.  They are under the skin around the anus.  When the swollen veins are scratched or broken by straining, rubbing or wiping they sometimes bleed.  How Hemorrhoids Occur  Veins in the rectum and around the anus tend to swell under pressure.  Hemorrhoids can result from increased pressure in the veins of your anus or rectum.  Some sources of pressure are:  Straining to have a bowel movement because of constipation  Waiting too long to have a bowel movement Coughing and sneezing often Sitting for extended periods of time, including on the toilet Diarrhea Obesity Trauma or injury to  the anus Some liver diseases Stress Family history of hemorrhoids Pregnancy  Pregnant women should try to avoid becoming constipated, because they are more likely to have hemorrhoids during pregnancy.  In the last trimester of pregnancy, the enlarged uterus may press on blood vessels and causes hemorrhoids.  In addition, the strain of childbirth sometimes causes hemorrhoids after the birth.  Symptoms of Hemorrhoids  Some symptoms of hemorrhoids include: Swelling and/or a tender lump around the anus Itching, mild burning and bleeding around the anus Painful bowel movements with or without constipation Bright red blood covering the stool, on toilet paper or in the toilet bowel.   Symptoms usually go away within a few days.  Always talk to your doctor about any bleeding to make sure it is not from some other causes.  Diagnosing and Treating Hemorrhoids  Diagnosis is made by an examination by your healthcare provider.  Special test can be performed by your doctor.    Most cases of hemorrhoids can be treated with: High-fiber diet: Eat more high-fiber foods, which help prevent constipation.  Ask for more detailed fiber information on types and sources of fiber from your healthcare provider. Fluids: Drink plenty of water.  This helps soften bowel movements so they are easier to pass. Sitz baths and cold packs: Sitting in lukewarm water two or three times a day for 15 minutes cleases the anal area and may relieve discomfort.  If the water is too hot, swelling around the anus will get worse.  Placing a cloth-covered ice pack on the anus for ten minutes four times a day can also help reduce selling.  Gently pushing a prolapsed hemorrhoid back inside after the bath or ice pack can be helpful. Medications: For mild discomfort, your healthcare provider may suggest over-the-counter pain medication or prescribe a cream or ointment for topical use.  The cream may contain witch hazel, zinc oxide or petroleum  jelly.  Medicated suppositories are also a treatment option.  Always consult your doctor before applying medications or creams. Procedures and surgeries: There are also a number of procedures and surgeries to shrink or remove hemorrhoids in more serious cases.  Talk to your physician about these options.  You can often prevent hemorrhoids or keep them from becoming worse by maintaining a healthy lifestyle.  Eat a fiber-rich diet of fruits, vegetables and whole grains.  Also, drink plenty of water and exercise regularly.   2007, [employer] Doc.30  Diverticulosis Diverticulosis is a condition that develops when small pouches (diverticula) form in the wall of the large intestine (colon). The colon is where water is absorbed and stool (feces) is formed. The pouches form when the inside layer of the colon pushes through weak spots in the outer layers of the colon. You may have a few pouches or many of them. The pouches usually do not cause problems unless they become inflamed or infected. When this happens, the condition is called diverticulitis- this is left lower quadrant pain, diarrhea, fever, chills, nausea or vomiting.  If this occurs please call the office or go to the hospital. Sometimes these patches without inflammation can also have painless bleeding associated with them, if this happens please call the office or go to the hospital. Preventing constipation and increasing fiber can help reduce diverticula and prevent complications. Even if you feel you  have a high-fiber diet, suggest getting on Benefiber or Cirtracel 2 times daily.

## 2023-06-09 NOTE — Progress Notes (Signed)
 06/09/2023 Greg Maldonado [id number] Apr 10, 1981  Referring provider: No ref. provider found Primary GI doctor: Dr. Chales Abrahams  ASSESSMENT AND PLAN:   Rectal Bleeding: Intermittent rectal bleeding for about a year, worsening recently. No associated rectal pain. No significant changes in bowel habits. No weight loss or abdominal pain. History of lower GI bleed in 2019. 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 -Insert hydrocortisone suppositories once daily for 7 days. -Add dietary fiber to diet. -Schedule colonoscopy in approximately 2 months to further investigate cause of bleeding.  Diverticulosis: Noted on previous CT scan. No current symptoms. -Add dietary fiber to diet.  General Health Maintenance: -Continue monitoring for changes in bowel habits or new symptoms. -Follow up after colonoscopy to discuss findings and next steps.     Patient Care Team: Patient, No Pcp Per as PCP - General (General Practice)  HISTORY OF PRESENT ILLNESS: 43 y.o. self-pay male without significant medical history and others listed below presents for evaluation of rectal bleeding.   05/04/2023 ER visit for bright red blood per rectum for several days with reported left lower quadrant abdominal discomfort. FOBT positive, CBC hemoglobin 15.5 no anemia no leukocytosis CT abdomen pelvis with contrast showed no acute findings, mild aortic atherosclerosis.  Few diverticula noted lower descending sigmoid colon no diverticulitis  Discussed the use of AI scribe software for clinical note transcription with the patient, who gave verbal consent to proceed.  History of Present Illness   The patient presents with rectal bleeding. He is accompanied by his girlfriend.  He has been experiencing rectal bleeding for approximately one year, primarily during bowel movements. The bleeding is bright red to dark red, occurs in the toilet, and is sometimes accompanied by a  dripping sensation. The bleeding is intermittent, with some weeks experiencing continuous bleeding and other weeks with spotting. No rectal pain, changes in bowel movement consistency, or abdominal pain. Bowel movements are regular, occurring daily or every other day, and are described as normal.  He was previously evaluated in the ER in 2019 for a lower GI bleed, which was also characterized by intermittent bleeding. The current episode is similar but more persistent, with the bleeding sometimes resulting in drips in his boxers.  He denies any history of blood thinners or NSAID use. 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 He does not consume alcohol or use drugs and has quit smoking cigarettes.     He  reports that he quit smoking about 3 years ago. His smoking use included cigarettes. He has never used smokeless tobacco. He reports current alcohol use of about 2.0 standard drinks of alcohol per week. He reports that he does not use drugs.  RELEVANT GI HISTORY, LABS, IMAGING: 05/04/2023 CT AB IMPRESSION: 1. No acute findings. No findings to account for abdominal pain. Specifically, no evidence of diverticulitis. 2. Mild aortic atherosclerosis   CBC    Component Value Date/Time   WBC 8.9 05/04/2023 0903   RBC 5.07 05/04/2023 0903   HGB 15.5 05/04/2023 0903   HCT 46.6 05/04/2023 0903   PLT 350 05/04/2023 0903   MCV 91.9 05/04/2023 0903   MCH 30.6 05/04/2023 0903   MCHC 33.3 05/04/2023 0903   RDW 12.8 05/04/2023 0903   LYMPHSABS 1.8 12/30/2022 1842   MONOABS 0.5 12/30/2022 1842   EOSABS 0.2 12/30/2022 1842   BASOSABS 0.0 12/30/2022 1842   Recent Labs    12/30/22 1842 05/04/23 0903  HGB 14.0  15.5    CMP     Component Value Date/Time   NA 136 05/04/2023 0903   K 3.7 05/04/2023 0903   CL 104 05/04/2023 0903   CO2 23 05/04/2023 0903   GLUCOSE 99 05/04/2023 0903   BUN 9 05/04/2023 0903   CREATININE 0.86 05/04/2023 0903   CALCIUM 9.5 05/04/2023 0903   PROT  7.5 05/04/2023 0903   ALBUMIN 3.9 05/04/2023 0903   AST 23 05/04/2023 0903   ALT 22 05/04/2023 0903   ALKPHOS 67 05/04/2023 0903   BILITOT 0.7 05/04/2023 0903   GFRNONAA >60 05/04/2023 0903      Latest Ref Rng & Units 05/04/2023    9:03 AM 12/30/2022    6:42 PM  Hepatic Function  Total Protein 6.5 - 8.1 g/dL 7.5  7.8   Albumin 3.5 - 5.0 g/dL 3.9  3.7   AST 15 - 41 U/L 23  23   ALT 0 - 44 U/L 22  23   Alk Phosphatase 38 - 126 U/L 67  74   Total Bilirubin 0.0 - 1.2 mg/dL 0.7  0.7       Current Medications:        Current Outpatient Medications (Other):    hydrocortisone (ANUSOL-HC) 25 MG suppository, Place 1 suppository (25 mg total) rectally 2 (two) times daily.   SUFLAVE 178.7 g SOLR, Take 1 kit by mouth once for 1 dose.  Medical History:  Past Medical History:  Diagnosis Date   Rectal bleeding    Allergies: No Known Allergies   Surgical History:  He  has a past surgical history that includes No past surgeries. Family History:  His family history includes Diabetes in his mother; Hypotension in his mother; Thyroid disease in his father and son.  REVIEW OF SYSTEMS  : All other systems reviewed and negative except where noted in the History of Present Illness.  PHYSICAL EXAM: BP 108/68 (BP Location: Left Arm, Patient Position: Sitting, Cuff Size: Normal)   Pulse 83   Ht 5\' 7"  (1.702 m)   Wt 208 lb 12.8 oz (94.7 kg)   BMI 32.70 kg/m  General Appearance: Well nourished, in no apparent distress. Head:   Normocephalic and atraumatic. Eyes:  sclerae anicteric,conjunctive pink  Respiratory: Respiratory effort normal, BS equal bilaterally without rales, rhonchi, wheezing. Cardio: RRR with no MRGs. Peripheral pulses intact.  Abdomen: Soft,  Obese ,active bowel sounds. No tenderness. No masses. Rectal: RECTAL: No external hemorrhoids observed upon inspection. Internal hemorrhoids identified upon palpation. Musculoskeletal: Full ROM, Normal gait. Without edema. Skin:   Dry and intact without significant lesions or rashes Neuro: Alert and  oriented x4;  No focal deficits. Psych:  Cooperative. Normal mood and affect.    Doree Albee, PA-C 12:12 PM

## 2023-08-06 ENCOUNTER — Telehealth: Payer: Self-pay | Admitting: Physician Assistant

## 2023-08-06 MED ORDER — BISACODYL EC 5 MG PO TBEC
20.0000 mg | DELAYED_RELEASE_TABLET | ORAL | 0 refills | Status: AC
Start: 2023-08-06 — End: ?

## 2023-08-06 MED ORDER — POLYETHYLENE GLYCOL 3350 17 GM/SCOOP PO POWD: 238.0000 g | ORAL | 0 refills | Status: DC

## 2023-08-06 MED ORDER — POLYETHYLENE GLYCOL 3350 17 GM/SCOOP PO POWD
255.0000 g | ORAL | 0 refills | Status: DC
Start: 2023-08-06 — End: 2023-08-06

## 2023-08-06 MED ORDER — SUFLAVE 178.7 G PO SOLR: 1.0000 | ORAL | 0 refills | Status: DC

## 2023-08-06 NOTE — Telephone Encounter (Signed)
 Prep was 60.01 per pharmacy patient said he wanted something cheaper. Changed to miralax and patient is coming to get instructions   Prep called into pharmacy for him and it was verified  it was there

## 2023-08-06 NOTE — Telephone Encounter (Signed)
 Rx for Suflave  sent to [facility] pharmacy on [address] as requested.

## 2023-08-06 NOTE — Telephone Encounter (Signed)
 Inbound call from patient, would like prep medication sent to  [facility] [place] because they never received the prep in the mail.    Patient would also like someone to call him to advise him on what his procedure will be covered at. Patient does not have mychart and would like the pre-cert team to contact him to know coverage details for procedure.  Procedure Is scheduled for 4/30 at 7:30 AM.

## 2023-08-09 NOTE — Telephone Encounter (Signed)
Patient was instructed and verbalized understanding.

## 2023-08-09 NOTE — Telephone Encounter (Signed)
 Patient said that he is coming at 1pm to get instructions

## 2023-08-11 ENCOUNTER — Encounter: Payer: Self-pay | Admitting: Gastroenterology

## 2023-08-11 ENCOUNTER — Ambulatory Visit (AMBULATORY_SURGERY_CENTER): Payer: Self-pay | Admitting: Gastroenterology

## 2023-08-11 VITALS — BP 113/58 | HR 64 | Temp 97.3°F | Resp 16 | Ht 67.0 in | Wt 208.0 lb

## 2023-08-11 DIAGNOSIS — K640 First degree hemorrhoids: Secondary | ICD-10-CM | POA: Diagnosis not present

## 2023-08-11 DIAGNOSIS — D128 Benign neoplasm of rectum: Secondary | ICD-10-CM

## 2023-08-11 DIAGNOSIS — K5730 Diverticulosis of large intestine without perforation or abscess without bleeding: Secondary | ICD-10-CM | POA: Diagnosis not present

## 2023-08-11 DIAGNOSIS — K625 Hemorrhage of anus and rectum: Secondary | ICD-10-CM

## 2023-08-11 MED ORDER — SODIUM CHLORIDE 0.9 % IV SOLN: 500.0000 mL | Freq: Once | INTRAVENOUS | Status: DC

## 2023-08-11 NOTE — Progress Notes (Signed)
 Pt's states no medical or surgical changes since previsit or office visit.

## 2023-08-11 NOTE — Patient Instructions (Signed)
 Handouts Provided:  Diverticulosis, polyps and High Fiber Diet  No aspirin, ibuprofen , naproxen , or other non-steroidal anti-inflammatory drugs for 5 days after polyp removal.  Use regular Metamucil one teaspoon by mouth daily with 8 ounces of water .  YOU HAD AN ENDOSCOPIC PROCEDURE TODAY AT THE [facility]:   Refer to the procedure report that was given to you for any specific questions about what was found during the examination.  If the procedure report does not answer your questions, please call your gastroenterologist to clarify.  If you requested that your care partner not be given the details of your procedure findings, then the procedure report has been included in a sealed envelope for you to review at your convenience later.  YOU SHOULD EXPECT: Some feelings of bloating in the abdomen. Passage of more gas than usual.  Walking can help get rid of the air that was put into your GI tract during the procedure and reduce the bloating. If you had a lower endoscopy (such as a colonoscopy or flexible sigmoidoscopy) you may notice spotting of blood in your stool or on the toilet paper. If you underwent a bowel prep for your procedure, you may not have a normal bowel movement for a few days.  Please Note:  You might notice some irritation and congestion in your nose or some drainage.  This is from the oxygen used during your procedure.  There is no need for concern and it should clear up in a day or so.  SYMPTOMS TO REPORT IMMEDIATELY:  Following lower endoscopy (colonoscopy or flexible sigmoidoscopy):  Excessive amounts of blood in the stool  Significant tenderness or worsening of abdominal pains  Swelling of the abdomen that is new, acute  Fever of 100F or higher  For urgent or emergent issues, a gastroenterologist can be reached at any hour by calling [phone]. Do not use MyChart messaging for urgent concerns.    DIET:  We do recommend a small meal at first, but then  you may proceed to your regular diet.  Drink plenty of fluids but you should avoid alcoholic beverages for 24 hours.  ACTIVITY:  You should plan to take it easy for the rest of today and you should NOT DRIVE or use heavy machinery until tomorrow (because of the sedation medicines used during the test).    FOLLOW UP: Our staff will call the number listed on your records the next business day following your procedure.  We will call around 7:15- 8:00 am to check on you and address any questions or concerns that you may have regarding the information given to you following your procedure. If we do not reach you, we will leave a message.     If any biopsies were taken you will be contacted by phone or by letter within the next 1-3 weeks.  Please call us  at [phone] if you have not heard about the biopsies in 3 weeks.    SIGNATURES/CONFIDENTIALITY: You and/or your care partner have signed paperwork which will be entered into your electronic medical record.  These signatures attest to the fact that that the information above on your After Visit Summary has been reviewed and is understood.  Full responsibility of the confidentiality of this discharge information lies with you and/or your care-partner.

## 2023-08-11 NOTE — Op Note (Signed)
 [facility] Patient Name: Greg Maldonado Procedure Date: 08/11/2023 8:01 AM MRN: [id number] Endoscopist: Lajuan Pila , MD, [id number] Age: 43 Referring MD:  Date of Birth: 1981/04/12 Gender: Male Account #: [id number] Procedure:                Colonoscopy Indications:              Rectal bleeding Medicines:                Monitored Anesthesia Care Procedure:                Pre-Anesthesia Assessment:                           - Prior to the procedure, a History and Physical                            was performed, and patient medications and                            allergies were reviewed. The patient's tolerance of                            previous anesthesia was also reviewed. The risks                            and benefits of the procedure and the sedation                            options and risks were discussed with the patient.                            All questions were answered, and informed consent                            was obtained. Prior Anticoagulants: The patient has                            taken no anticoagulant or antiplatelet agents. ASA                            Grade Assessment: II - A patient with mild systemic                            disease. After reviewing the risks and benefits,                            the patient was deemed in satisfactory condition to                            undergo the procedure.                           After obtaining informed consent, the colonoscope  was passed under direct vision. Throughout the                            procedure, the patient's blood pressure, pulse, and                            oxygen saturations were monitored continuously. The                            Olympus Scope SN [id number] was introduced through the                            anus and advanced to the 4 cm into the ileum. The                            colonoscopy was performed without  difficulty. The                            patient tolerated the procedure well. The quality                            of the bowel preparation was good. The terminal                            ileum, ileocecal valve, appendiceal orifice, and                            rectum were photographed. Scope In: 8:39:50 AM Scope Out: 8:48:22 AM Scope Withdrawal Time: 0 hours 6 minutes 42 seconds  Total Procedure Duration: 0 hours 8 minutes 32 seconds  Findings:                 A 10 mm polyp was found in the proximal rectum, 15                            cm from the anal verge. The polyp was                            semi-pedunculated. The polyp was removed with a hot                            snare. Resection and retrieval were complete.                           Many small-mouthed diverticula were found in the                            sigmoid colon.                           Non-bleeding internal hemorrhoids were found during                            retroflexion and during perianal exam. The  hemorrhoids were small and Grade I (internal                            hemorrhoids that do not prolapse).                           The terminal ileum appeared normal.                           The exam was otherwise without abnormality on                            direct and retroflexion views. Complications:            No immediate complications. Estimated Blood Loss:     Estimated blood loss: none. Impression:               - One 10 mm polyp in the rectum, removed with a hot                            snare. Resected and retrieved.                           - Mild sigmoid diverticulosis.                           - Non-bleeding internal hemorrhoids.                           - The examined portion of the ileum was normal.                           - The examination was otherwise normal on direct                            and retroflexion views. Recommendation:            - Patient has a contact number available for                            emergencies. The signs and symptoms of potential                            delayed complications were discussed with the                            patient. Return to normal activities tomorrow.                            Written discharge instructions were provided to the                            patient.                           - High fiber diet.                           -  Continue present medications.                           - Await pathology results.                           - No aspirin, ibuprofen , naproxen , or other                            non-steroidal anti-inflammatory drugs for 5 days                            after polyp removal.                           - Use original regular Metamucil one teaspoon PO                            daily with 8 ounces of water .                           - The findings and recommendations were discussed                            with the patient's family. Lajuan Pila, MD 08/11/2023 8:55:01 AM This report has been signed electronically.

## 2023-08-11 NOTE — Progress Notes (Signed)
 06/09/2023 Greg Maldonado [id number] 11/15/1980   Referring provider: No ref. provider found Primary GI doctor: Dr. Venice Gillis   ASSESSMENT AND PLAN:    Rectal Bleeding: Intermittent rectal bleeding for about a year, worsening recently. No associated rectal pain. No significant changes in bowel habits. No weight loss or abdominal pain. History of lower GI bleed in 2019. 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 -Insert hydrocortisone  suppositories once daily for 7 days. -Add dietary fiber to diet. -Schedule colonoscopy in approximately 2 months to further investigate cause of bleeding.   Diverticulosis: Noted on previous CT scan. No current symptoms. -Add dietary fiber to diet.   General Health Maintenance: -Continue monitoring for changes in bowel habits or new symptoms. -Follow up after colonoscopy to discuss findings and next steps.     Patient Care Team: Patient, No Pcp Per as PCP - General (General Practice)   HISTORY OF PRESENT ILLNESS: 42 y.o. self-pay male without significant medical history and others listed below presents for evaluation of rectal bleeding.    05/04/2023 ER visit for bright red blood per rectum for several days with reported left lower quadrant abdominal discomfort. FOBT positive, CBC hemoglobin 15.5 no anemia no leukocytosis CT abdomen pelvis with contrast showed no acute findings, mild aortic atherosclerosis.  Few diverticula noted lower descending sigmoid colon no diverticulitis   Discussed the use of AI scribe software for clinical note transcription with the patient, who gave verbal consent to proceed.   History of Present Illness   The patient presents with rectal bleeding. He is accompanied by his girlfriend.   He has been experiencing rectal bleeding for approximately one year, primarily during bowel movements. The bleeding is bright red to dark red, occurs in the toilet, and is sometimes accompanied  by a dripping sensation. The bleeding is intermittent, with some weeks experiencing continuous bleeding and other weeks with spotting. No rectal pain, changes in bowel movement consistency, or abdominal pain. Bowel movements are regular, occurring daily or every other day, and are described as normal.   He was previously evaluated in the ER in 2019 for a lower GI bleed, which was also characterized by intermittent bleeding. The current episode is similar but more persistent, with the bleeding sometimes resulting in drips in his boxers.   He denies any history of blood thinners or NSAID use. 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 He does not consume alcohol or use drugs and has quit smoking cigarettes.     He  reports that he quit smoking about 3 years ago. His smoking use included cigarettes. He has never used smokeless tobacco. He reports current alcohol use of about 2.0 standard drinks of alcohol per week. He reports that he does not use drugs.   RELEVANT GI HISTORY, LABS, IMAGING: 05/04/2023 CT AB IMPRESSION: 1. No acute findings. No findings to account for abdominal pain. Specifically, no evidence of diverticulitis. 2. Mild aortic atherosclerosis    CBC Labs (Brief)          Component Value Date/Time    WBC 8.9 05/04/2023 0903    RBC 5.07 05/04/2023 0903    HGB 15.5 05/04/2023 0903    HCT 46.6 05/04/2023 0903    PLT 350 05/04/2023 0903    MCV 91.9 05/04/2023 0903    MCH 30.6 05/04/2023 0903    MCHC 33.3 05/04/2023 0903    RDW 12.8 05/04/2023 0903    LYMPHSABS 1.8 12/30/2022 1842  MONOABS 0.5 12/30/2022 1842    EOSABS 0.2 12/30/2022 1842    BASOSABS 0.0 12/30/2022 1842      Recent Labs (within last 365 days)      Recent Labs    12/30/22 1842 05/04/23 0903  HGB 14.0 15.5        CMP     Labs (Brief)          Component Value Date/Time    NA 136 05/04/2023 0903    K 3.7 05/04/2023 0903    CL 104 05/04/2023 0903    CO2 23 05/04/2023 0903     GLUCOSE 99 05/04/2023 0903    BUN 9 05/04/2023 0903    CREATININE 0.86 05/04/2023 0903    CALCIUM 9.5 05/04/2023 0903    PROT 7.5 05/04/2023 0903    ALBUMIN 3.9 05/04/2023 0903    AST 23 05/04/2023 0903    ALT 22 05/04/2023 0903    ALKPHOS 67 05/04/2023 0903    BILITOT 0.7 05/04/2023 0903    GFRNONAA >60 05/04/2023 0903          Latest Ref Rng & Units 05/04/2023    9:03 AM 12/30/2022    6:42 PM  Hepatic Function  Total Protein 6.5 - 8.1 g/dL 7.5  7.8   Albumin 3.5 - 5.0 g/dL 3.9  3.7   AST 15 - 41 U/L 23  23   ALT 0 - 44 U/L 22  23   Alk Phosphatase 38 - 126 U/L 67  74   Total Bilirubin 0.0 - 1.2 mg/dL 0.7  0.7       Current Medications:              Current Outpatient Medications (Other):    hydrocortisone  (ANUSOL -HC) 25 MG suppository, Place 1 suppository (25 mg total) rectally 2 (two) times daily.   SUFLAVE  178.7 g SOLR, Take 1 kit by mouth once for 1 dose.   Medical History:      Past Medical History:  Diagnosis Date   Rectal bleeding          Allergies:  Allergies  No Known Allergies      Surgical History:  He  has a past surgical history that includes No past surgeries. Family History:  His family history includes Diabetes in his mother; Hypotension in his mother; Thyroid disease in his father and son.   REVIEW OF SYSTEMS  : All other systems reviewed and negative except where noted in the History of Present Illness.   PHYSICAL EXAM: BP 108/68 (BP Location: Left Arm, Patient Position: Sitting, Cuff Size: Normal)   Pulse 83   Ht 5\' 7"  (1.702 m)   Wt 208 lb 12.8 oz (94.7 kg)   BMI 32.70 kg/m  General Appearance: Well nourished, in no apparent distress. Head:   Normocephalic and atraumatic. Eyes:  sclerae anicteric,conjunctive pink  Respiratory: Respiratory effort normal, BS equal bilaterally without rales, rhonchi, wheezing. Cardio: RRR with no MRGs. Peripheral pulses intact.  Abdomen: Soft,  Obese ,active bowel sounds. No tenderness. No  masses. Rectal: RECTAL: No external hemorrhoids observed upon inspection. Internal hemorrhoids identified upon palpation. Musculoskeletal: Full ROM, Normal gait. Without edema. Skin:  Dry and intact without significant lesions or rashes Neuro: Alert and  oriented x4;  No focal deficits. Psych:  Cooperative. Normal mood and affect.      Edmonia Gottron, PA-C   Attending physician's note   I have taken history, reviewed the chart and examined the patient. I performed a substantive  portion of this encounter, including complete performance of at least one of the key components, in conjunction with the APP. I agree with the Advanced Practitioner's note, impression and recommendations.  For colon today. Nl CBC  Magnus Schuller, MD [facility] [phone]

## 2023-08-11 NOTE — Progress Notes (Signed)
 Pt A/O x 3, gd SR's, pleased with anesthesia, report to RN

## 2023-08-11 NOTE — Progress Notes (Signed)
 Called to room to assist during endoscopic procedure.  Patient ID and intended procedure confirmed with present staff. Received instructions for my participation in the procedure from the performing physician.

## 2023-08-12 ENCOUNTER — Telehealth: Payer: Self-pay

## 2023-08-12 NOTE — Telephone Encounter (Signed)
  Follow up Call-     08/11/2023    7:38 AM  Call back number  Post procedure Call Back phone  # [phone]  Permission to leave phone message Yes     Patient questions:  Do you have a fever, pain , or abdominal swelling? No. Pain Score  0 *  Have you tolerated food without any problems? Yes.    Have you been able to return to your normal activities? Yes.    Do you have any questions about your discharge instructions: Diet   No. Medications  No. Follow up visit  No.  Do you have questions or concerns about your Care? No.  Actions: * If pain score is 4 or above: No action needed, pain <4.

## 2023-08-13 LAB — SURGICAL PATHOLOGY

## 2023-08-15 ENCOUNTER — Encounter: Payer: Self-pay | Admitting: Gastroenterology

## 2023-09-23 ENCOUNTER — Emergency Department (HOSPITAL_COMMUNITY)
Admission: EM | Admit: 2023-09-23 | Discharge: 2023-09-23 | Disposition: A | Discharge status: Home / Self Care | Attending: Emergency Medicine | Admitting: Emergency Medicine

## 2023-09-23 ENCOUNTER — Other Ambulatory Visit: Payer: Self-pay

## 2023-09-23 DIAGNOSIS — L0291 Cutaneous abscess, unspecified: Secondary | ICD-10-CM

## 2023-09-23 DIAGNOSIS — K130 Diseases of lips: Secondary | ICD-10-CM | POA: Diagnosis present

## 2023-09-23 DIAGNOSIS — R7309 Other abnormal glucose: Secondary | ICD-10-CM | POA: Insufficient documentation

## 2023-09-23 LAB — CBG MONITORING, ED: Glucose-Capillary: 109 mg/dL — ABNORMAL HIGH (ref 70–99)

## 2023-09-23 MED ORDER — IBUPROFEN 800 MG PO TABS
800.0000 mg | ORAL_TABLET | Freq: Once | ORAL | Status: AC
  Administered 2023-09-23: 800 mg via ORAL
  Filled 2023-09-23: qty 1

## 2023-09-23 MED ORDER — LIDOCAINE HCL (PF) 1 % IJ SOLN
5.0000 mL | Freq: Once | INTRAMUSCULAR | Status: AC
  Administered 2023-09-23: 5 mL
  Filled 2023-09-23: qty 30

## 2023-09-23 MED ORDER — DOXYCYCLINE HYCLATE 100 MG PO TABS
100.0000 mg | ORAL_TABLET | Freq: Once | ORAL | Status: AC
  Administered 2023-09-23: 100 mg via ORAL
  Filled 2023-09-23: qty 1

## 2023-09-23 MED ORDER — DOXYCYCLINE HYCLATE 100 MG PO CAPS: 100.0000 mg | ORAL_CAPSULE | Freq: Two times a day (BID) | ORAL | 0 refills | Status: AC

## 2023-09-23 NOTE — Discharge Instructions (Signed)
 Please been taking doxycycline  twice a day for 7 days.  Please take ibuprofen  for pain.  Follow-up with your PCP.  I have referred you to 1 if you do not have one.  Please call and make an appointment to be seen.  Return to the ED with any new or worsening signs or symptoms.

## 2023-09-23 NOTE — ED Triage Notes (Signed)
 Pt has abscess to bottom lip that has gotten more swollen over the last couple days.

## 2023-09-23 NOTE — ED Provider Notes (Signed)
 [place] EMERGENCY DEPARTMENT AT [facility] Provider Note   CSN: [id number] Arrival date & time: 09/23/23  0144     History  Chief Complaint  Patient presents with   Abscess    Greg Maldonado is a 43 y.o. male who presents to the ED for evaluation of lower lip abscess.  States on Sunday he noticed a bump to his lower lip that is progressively worsening grown in size since onset.  Reports this evening he noticed a small amount of purulent drainage from this area.  Endorses history of abscesses.  Denies history of diabetes.  Denies fevers, nausea or vomiting at home.  Denies medications prior to arrival.   Abscess      Home Medications Prior to Admission medications   Medication Sig Start Date End Date Taking? Authorizing Provider  doxycycline  (VIBRAMYCIN ) 100 MG capsule Take 1 capsule (100 mg total) by mouth 2 (two) times daily. 09/23/23  Yes Adel Aden, PA-C  bisacodyl  5 MG EC tablet Take 4 tablets (20 mg total) by mouth as directed. Patient not taking: Reported on 08/11/2023 08/06/23   Lajuan Pila, MD  hydrocortisone  (ANUSOL -HC) 25 MG suppository Place 1 suppository (25 mg total) rectally 2 (two) times daily. Patient not taking: Reported on 08/11/2023 06/09/23   Edmonia Gottron, PA-C  famotidine  (PEPCID ) 20 MG tablet Take 1 tablet (20 mg total) by mouth 2 (two) times daily. Patient not taking: Reported on 07/16/2014 05/30/14 10/24/14  Pisciotta, Peterson Brandt, PA-C      Allergies    Patient has no known allergies.    Review of Systems   Review of Systems  Skin:        Abscess  All other systems reviewed and are negative.   Physical Exam Updated Vital Signs BP (!) 114/97   Pulse 79   Temp 98.2 F (36.8 C)   Resp 19   SpO2 98%  Physical Exam Vitals and nursing note reviewed.  Constitutional:      General: He is not in acute distress.    Appearance: He is well-developed.  HENT:     Head: Normocephalic and atraumatic.     Mouth/Throat:       Comments: Area of induration, fluctuance with purulent drainage.  See picture.  Eyes:     Conjunctiva/sclera: Conjunctivae normal.    Cardiovascular:     Rate and Rhythm: Normal rate and regular rhythm.     Heart sounds: No murmur heard. Pulmonary:     Effort: Pulmonary effort is normal. No respiratory distress.     Breath sounds: Normal breath sounds.  Abdominal:     Palpations: Abdomen is soft.     Tenderness: There is no abdominal tenderness.   Musculoskeletal:        General: No swelling.     Cervical back: Neck supple.   Skin:    General: Skin is warm and dry.     Capillary Refill: Capillary refill takes less than 2 seconds.   Neurological:     Mental Status: He is alert and oriented to person, place, and time. Mental status is at baseline.   Psychiatric:        Mood and Affect: Mood normal.     ED Results / Procedures / Treatments   Labs (all labs ordered are listed, but only abnormal results are displayed) Labs Reviewed  CBG MONITORING, ED - Abnormal; Notable for the following components:      Result Value   Glucose-Capillary 109 (*)  All other components within normal limits    EKG None  Radiology No results found.  Procedures Procedures   Medications Ordered in ED Medications  ibuprofen  (ADVIL ) tablet 800 mg (has no administration in time range)  doxycycline  (VIBRA -TABS) tablet 100 mg (has no administration in time range)  lidocaine  (PF) (XYLOCAINE ) 1 % injection 5 mL (5 mLs Other Given by Other 09/23/23 0430)    ED Course/ Medical Decision Making/ A&P  Medical Decision Making Risk Prescription drug management.   43 year old male presents for evaluation.  Please see HPI for further details.  On examination the patient is afebrile and nontachycardic.  Lung sounds are clear bilaterally, nonhypoxic.  Abdomen soft compressible.  Neurological examination at baseline.  Patient bite on left with area of induration, fluctuance with small amount of  purulence expressed.  Attempted to aspirate area.  Patient had IV medication applied.  Patient then had small amount of purulence aspirated from the bottom lip.  Will send patient home on doxycycline  which will take 7 days twice daily.  Will have him follow-up with his PCP.  Stable to discharge.   Final Clinical Impression(s) / ED Diagnoses Final diagnoses:  Abscess    Rx / DC Orders ED Discharge Orders          Ordered    doxycycline  (VIBRAMYCIN ) 100 MG capsule  2 times daily        09/23/23 0450              Adel Aden, PA-C 09/23/23 0451    Alissa April, MD 09/23/23 [phone]

## 2024-03-14 ENCOUNTER — Encounter (HOSPITAL_COMMUNITY): Payer: Self-pay

## 2024-03-14 ENCOUNTER — Emergency Department (HOSPITAL_COMMUNITY)
Admission: EM | Admit: 2024-03-14 | Discharge: 2024-03-14 | Disposition: A | Discharge status: Home / Self Care | Attending: Emergency Medicine | Admitting: Emergency Medicine

## 2024-03-14 ENCOUNTER — Other Ambulatory Visit: Payer: Self-pay

## 2024-03-14 DIAGNOSIS — L02811 Cutaneous abscess of head [any part, except face]: Secondary | ICD-10-CM | POA: Diagnosis present

## 2024-03-14 DIAGNOSIS — L089 Local infection of the skin and subcutaneous tissue, unspecified: Secondary | ICD-10-CM

## 2024-03-14 MED ORDER — DOXYCYCLINE HYCLATE 100 MG PO CAPS: 100.0000 mg | ORAL_CAPSULE | Freq: Two times a day (BID) | ORAL | 0 refills | Status: AC

## 2024-03-14 MED ORDER — LIDOCAINE-EPINEPHRINE 2 %-1:100000 IJ SOLN
20.0000 mL | Freq: Once | INTRAMUSCULAR | Status: AC
  Administered 2024-03-14: 20 mL via INTRADERMAL
  Filled 2024-03-14: qty 1

## 2024-03-14 MED ORDER — MUPIROCIN 2 % EX OINT: TOPICAL_OINTMENT | CUTANEOUS | 0 refills | Status: AC

## 2024-03-14 NOTE — Discharge Instructions (Addendum)
 ### Recurrent Skin Infection Guide     ## ENGLISH VERSION        **What Are Recurrent Skin Abscesses and Infections?**      Skin abscesses (also called boils) are **painful, pus-filled bumps under the skin** caused by bacteria. When these infections keep coming back, they are called recurrent infections. The most common bacteria that cause these infections is called *Staphylococcus aureus* (or staph for short).      **What Is the Difference Between MSSA and MRSA?**      There are two main types of staph bacteria:      - **MSSA (Methicillin-Sensitive Staphylococcus aureus)**: This type of staph responds to common antibiotics like penicillin-related drugs.      - **MRSA (Methicillin-Resistant Staphylococcus aureus)**: This type of staph is resistant to many common antibiotics, which means it is harder to treat. MRSA has become more common in recent years and now causes most skin abscesses.[1]      Both types can cause the same kinds of infections, but MRSA requires different antibiotics for treatment.[2]      **Your Medications**      You have been prescribed two medications to treat your infection and help prevent it from coming back:      **Doxycycline  (antibiotic pill)**      - Take this medication exactly as directed, even if you start feeling better before finishing all the pills.[3]      - Skipping doses or not finishing the full course can make the infection harder to treat in the future.[3]      - **Avoid excessive sunlight** while taking this medication, as it can make your skin more sensitive to the sun. Use sunscreen when going outside.[3]      - This medication may increase the risk of yeast infections.[3]      - If you develop severe diarrhea (especially with blood), contact your doctor right away.[3]      **Nasal Mupirocin (antibiotic ointment for the nose)**      - This ointment is applied inside your nostrils to kill bacteria that may be living there.      -  Apply a small amount to the inside of each nostril twice daily, typically for 5 days.[4]      - This helps prevent the bacteria from spreading and causing new infections.      - Do not use this ointment in your eyes.[5]      **How to Prevent Infections from Coming Back**      Many people carry staph bacteria on their skin or in their nose without knowing it. These bacteria can cause repeated infections. The following steps can help reduce your risk:[4]      **Personal Hygiene:**      - Wash your hands frequently with soap and water .      - Shower daily, especially after exercise or sweating.      - Consider using an antibacterial skin cleanser (like chlorhexidine) during your shower for 5 days.[4]      - Keep any cuts, scrapes, or wounds clean and covered with a bandage until healed.      - Do not pick at or squeeze skin infections, as this can spread bacteria.      **Household Items:**      - Wash towels, sheets, and clothes daily in hot water  during treatment.[4]      - Do not share personal items like towels, razors, or clothing with others.      -  Clean frequently touched surfaces (like doorknobs and countertops) regularly.      **When to Seek Medical Care:**      - If you develop a new abscess or boil, see your doctor early so it can be drained and cultured.[4]      - If you have fever, chills, or the infection is spreading despite treatment.      - If the infection does not improve within 3-5 days of treatment.      **Important Information About Household Members**      If you keep getting skin infections, your household members may also be carrying the bacteria. Some studies show that treating household members with the same decolonization steps (nasal ointment and skin washes) can help reduce your risk of getting another infection. Talk to your doctor if infections continue to occur.[2]      **Why Do Some Infections Keep Coming Back?**      Recurrent infections in  the same spot may be caused by other conditions like ingrown hairs, cysts, or foreign material under the skin. If your infections keep returning in the same location, your doctor may need to investigate further.[4]      **Key Points to Remember:**      - Take all medications exactly as prescribed.      - Practice good hygiene and wash personal items daily.      - Seek medical care early if a new infection develops.      - Consider having household members follow similar prevention steps if infections continue.      ---      ## VERSIN EN ESPAOL        **Qu Son los Dexter e Infecciones Recurrentes de la Piel?**      Los abscesos de la piel (tambin llamados fornculos) son **bultos dolorosos llenos de pus debajo de la piel** causados por bacterias. Cuando estas infecciones siguen regresando, se llaman infecciones recurrentes. La bacteria ms comn que causa estas infecciones se llama *Staphylococcus aureus* (o estafilococo para abreviar).      **Cul Es la Diferencia Entre MSSA y MRSA?**      Hay dos tipos principales de bacterias estafiloccicas:      - **MSSA (Staphylococcus aureus Sensible a Meticilina)**: Este tipo de estafilococo responde a antibiticos comunes como los relacionados con la penicilina.      - **MRSA (Staphylococcus aureus Resistente a Meticilina)**: Este tipo de estafilococo es resistente a muchos antibiticos comunes, lo que significa que es ms difcil de tratar. MRSA se ha vuelto ms comn en los ltimos aos y ahora causa la mayora de los abscesos de la piel.[1]      Ambos tipos pueden causar los mismos tipos de infecciones, pero MRSA requiere antibiticos diferentes para el tratamiento.[2]      **Sus Medicamentos**      Se le han recetado dos medicamentos para tratar su infeccin y ayudar a prevenir que regrese:      **Doxiciclina (pastilla antibitica)**      - Tome este medicamento exactamente como se le indic, incluso si comienza a  sentirse mejor antes de terminar todas las pastillas.[3]      - Saltarse dosis o no completar el tratamiento completo puede hacer que la infeccin sea ms difcil de tratar en el futuro.[3]      - **Evite la luz solar excesiva** mientras toma este medicamento, ya que puede hacer que su piel sea ms sensible al sol. Use protector solar cuando salga.[3]      -  Este medicamento puede aumentar el riesgo de infecciones por hongos.[3]      - Si desarrolla diarrea severa (especialmente con sangre), comunquese con su mdico de inmediato.[3]      **Mupirocina Nasal (ungento antibitico para la nariz)**      - Este ungento se aplica dentro de las fosas nasales para matar las bacterias que pueden estar viviendo all.      - Aplique una pequea cantidad dentro de cada fosa nasal dos veces al da, tpicamente durante 5 das.[4]      - Esto ayuda a prevenir que las bacterias se propaguen y causen nuevas infecciones.      - No use este ungento en los ojos.[5]      **Cmo Prevenir Que las Infecciones Regresen**      Muchas personas llevan bacterias estafiloccicas en la piel o en la nariz sin saberlo. Estas bacterias pueden causar infecciones repetidas. Los siguientes pasos pueden ayudar a reducir su riesgo:[4]      **Higiene Personal:**      - Lv      ### References  1. A Placebo-Controlled Trial of Antibiotics for Smaller Skin Abscesses. Lourena MINES, Miller LG, Immergluck L, et al. [facility] Journal of Medicine. 2017;376(26):2545-2555. doi:10.1056/NEJMoa1607033. 2. Management of Skin Abscesses in the Era of Methicillin-Resistant Staphylococcus aureus. Steffanie BARON, Talan DA. The [place] Journal of Medicine. 2014;370(11):1039-47. doi:10.1056/NEJMra1212788. 3. Doxycycline . Food and Drug Administration. Updated date: 2024-03-09. 4. Practice Guidelines for the Diagnosis and Management of Skin and Soft Tissue Infections: 2014 Update by the Infectious Diseases Society of America. Mannie PONCHO,  Bisno AL, Chambers HF, et al. Clinical Infectious Diseases : An Official Publication of the Infectious Diseases Society of America. 2014;59(2):147-59. doi:10.1093/cid/ciu296. 5. MUPIROCIN. Food and Drug Administration. Updated date: 2024-01-19.

## 2024-03-14 NOTE — ED Triage Notes (Signed)
 Pt reports he has an abscess to the top/back of his head that he noticed on Wednesday. Denies fever/chills.

## 2024-03-14 NOTE — ED Provider Notes (Signed)
 [facility] AT [facility] Provider Note   CSN: [id number] Arrival date & time: 03/14/24  1446     Patient presents with: Abscess   Greg Maldonado is a 43 y.o. male with a history of recurrent abscess.  Patient reports that he has had progressively worsening pain and swelling at the top of his scalp.  He reports fluctuance and pain.  He denies any drainage or fever.    Abscess      Prior to Admission medications   Medication Sig Start Date End Date Taking? Authorizing Provider  bisacodyl  5 MG EC tablet Take 4 tablets (20 mg total) by mouth as directed. Patient not taking: Reported on 08/11/2023 08/06/23   Charlanne Groom, MD  doxycycline  (VIBRAMYCIN ) 100 MG capsule Take 1 capsule (100 mg total) by mouth 2 (two) times daily. 09/23/23   Ruthell Lonni FALCON, PA-C  hydrocortisone  (ANUSOL -HC) 25 MG suppository Place 1 suppository (25 mg total) rectally 2 (two) times daily. Patient not taking: Reported on 08/11/2023 06/09/23   Craig Alan SAUNDERS, PA-C  famotidine  (PEPCID ) 20 MG tablet Take 1 tablet (20 mg total) by mouth 2 (two) times daily. Patient not taking: Reported on 07/16/2014 05/30/14 10/24/14  Pisciotta, Nat, PA-C    Allergies: Patient has no known allergies.    Review of Systems  Updated Vital Signs BP 123/78 (BP Location: Left Arm)   Pulse 88   Temp 98 F (36.7 C) (Oral)   Resp 18   Ht 5' 7 (1.702 m)   Wt 95.3 kg   SpO2 96%   BMI 32.89 kg/m   Physical Exam Vitals and nursing note reviewed.  Constitutional:      General: He is not in acute distress.    Appearance: He is well-developed. He is not diaphoretic.  HENT:     Head: Normocephalic and atraumatic.     Comments: Area of induration erythema and tenderness at the crown of the head.  Minimal fluctuance at best.  No streaking Eyes:     General: No scleral icterus.    Conjunctiva/sclera: Conjunctivae normal.  Cardiovascular:     Rate and Rhythm: Normal rate and regular rhythm.      Heart sounds: Normal heart sounds.  Pulmonary:     Effort: Pulmonary effort is normal. No respiratory distress.     Breath sounds: Normal breath sounds.  Abdominal:     Palpations: Abdomen is soft.     Tenderness: There is no abdominal tenderness.  Musculoskeletal:     Cervical back: Normal range of motion and neck supple.  Skin:    General: Skin is warm and dry.  Neurological:     Mental Status: He is alert.  Psychiatric:        Behavior: Behavior normal.     (all labs ordered are listed, but only abnormal results are displayed) Labs Reviewed - No data to display  EKG: None  Radiology: No results found.   .Incision and Drainage  Date/Time: 03/14/2024 6:18 PM  Performed by: Arloa Chroman, PA-C Authorized by: Arloa Chroman, PA-C   Consent:    Consent obtained:  Verbal   Consent given by:  Patient   Risks discussed:  Bleeding, incomplete drainage, pain and infection   Alternatives discussed:  No treatment Universal protocol:    Patient identity confirmed:  Arm band Location:    Type:  Abscess   Size:  1 CM   Location:  Head   Head location:  Scalp Pre-procedure details:  Skin preparation:  Povidone-iodine Anesthesia:    Anesthesia method:  Local infiltration   Local anesthetic:  Lidocaine  2% WITH epi Procedure type:    Complexity:  Simple Procedure details:    Incision types:  Cruciate   Incision depth:  Dermal   Wound management:  Probed and deloculated and irrigated with saline   Drainage:  Bloody   Drainage amount:  Scant   Wound treatment:  Wound left open Post-procedure details:    Procedure completion:  Tolerated well, no immediate complications    Medications Ordered in the ED  lidocaine -EPINEPHrine  (XYLOCAINE  W/EPI) 2 %-1:100000 (with pres) injection 20 mL (has no administration in time range)                                    Medical Decision Making Risk Prescription drug management.   Patient with recurrent skin infection, no  purulent drainage noted after incision and drainage.  Attempted to obtain culture as he has history of recurrent skin infections wanted to rule out MRSA he is not sure if he has this diagnosis however there was no real discharge from the incision and drainage.  I discussed doing this procedure with the patient as this was a very small area however he was requesting that we go ahead with incision and drainage.  Given his history of recurrent skin infections I will treat the patient for MRSA infection including nasal mupirocin at home precautions as well as oral doxycycline  and strict return precautions.  No evidence of deep space abscess no other lesions noted appropriate for discharge at this time     Final diagnoses:  None    ED Discharge Orders     None          Arloa Chroman, PA-C 03/14/24 1820    Lenor Hollering, MD 03/14/24 2320
